# Patient Record
Sex: Female | Born: 1945
Health system: Southern US, Community
[De-identification: ages and names within clinical notes are randomized; demographics above are authoritative.]

## PROBLEM LIST (undated history)

## (undated) DIAGNOSIS — E079 Disorder of thyroid, unspecified: Secondary | ICD-10-CM

## (undated) DIAGNOSIS — C801 Malignant (primary) neoplasm, unspecified: Secondary | ICD-10-CM

---
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
## 1973-03-05 HISTORY — PX: THYROIDECTOMY: SHX17

## 2001-03-05 HISTORY — PX: ABDOMINAL HYSTERECTOMY: SHX81

## 2002-02-11 ENCOUNTER — Observation Stay (HOSPITAL_COMMUNITY): Admission: RE | Admit: 2002-02-11 | Discharge: 2002-02-12 | Payer: Self-pay | Admitting: Obstetrics and Gynecology

## 2004-01-19 ENCOUNTER — Ambulatory Visit: Payer: Self-pay

## 2006-02-14 ENCOUNTER — Ambulatory Visit: Payer: Self-pay

## 2007-04-09 ENCOUNTER — Ambulatory Visit: Payer: Self-pay

## 2007-10-10 LAB — HM COLONOSCOPY

## 2008-04-28 ENCOUNTER — Ambulatory Visit: Payer: Self-pay

## 2009-04-12 ENCOUNTER — Ambulatory Visit: Payer: Self-pay | Admitting: Sports Medicine

## 2009-05-05 ENCOUNTER — Ambulatory Visit: Payer: Self-pay

## 2011-05-15 ENCOUNTER — Ambulatory Visit: Payer: Self-pay

## 2013-02-17 ENCOUNTER — Encounter: Payer: Self-pay | Admitting: Sports Medicine

## 2013-02-17 ENCOUNTER — Encounter (INDEPENDENT_AMBULATORY_CARE_PROVIDER_SITE_OTHER): Payer: Self-pay

## 2013-02-17 ENCOUNTER — Ambulatory Visit (INDEPENDENT_AMBULATORY_CARE_PROVIDER_SITE_OTHER): Payer: BC Managed Care – PPO | Admitting: Sports Medicine

## 2013-02-17 VITALS — Ht 66.0 in | Wt 150.0 lb

## 2013-02-17 DIAGNOSIS — M25561 Pain in right knee: Secondary | ICD-10-CM

## 2013-02-17 DIAGNOSIS — M25569 Pain in unspecified knee: Secondary | ICD-10-CM

## 2013-02-17 MED ORDER — NAPROXEN 500 MG PO TABS: ORAL_TABLET | ORAL | Status: DC

## 2013-02-17 NOTE — Progress Notes (Signed)
   Subjective:    Patient ID: Kellie Roberson, female    DOB: 1946/02/09, 67 y.o.   MRN: [id number]  HPI chief complaint: Right knee pain  Very pleasant 67 year old female comes in today complaining of one week of right knee pain. No specific injury that she can recall but rather sudden onset of medial sided knee pain that began upon awakening one morning. She describes an aching discomfort along the medial knee which is worse with weightbearing and with twisting. She has noticed some mild swelling as well. No instability. No locking of the knee. No radiating pain. Symptoms improve at rest. She has been taking intermittent doses of over-the-counter ibuprofen which has seemed to help. She has also tried a off the shelf knee sleeve which has not been of any benefit. She denies any problems with his knee in the past. No prior knee surgeries. No associated numbness or tingling.  Past medical history and current medications are reviewed She has a history of hypothyroidism after a thyroidectomy No known drug allergies She does not smoke, drinks a glass of wine each evening, works as a Comptroller at [employer]    Review of Systems     Objective:   Physical Exam Well-developed, fit-appearing. No acute distress. Awake alert and oriented x3. Vital signs are reviewed  Right knee: Range of motion 0-130. No effusion. There is tenderness to palpation along the medial joint line with pain but no popping with McMurray's maneuver. Positive Thessaly's medially. No tenderness to palpation along the lateral joint line. No significant patellofemoral crepitus. Good overall alignment. Knee is stable to valgus and varus stressing. Negative anterior drawer, negative posterior drawer. Neurovascularly intact distally. Walking with a slight limp.  MSK ultrasound of the right knee: No obvious joint effusion. Visualized portion of the medial meniscus appears to be intact but there is some bulging of the medial meniscus  from the joint space.       Assessment & Plan:  Right knee pain likely secondary to degenerative medial meniscal tear versus DJD  I discussed the patient's options with her including oral anti-inflammatories versus a cortisone injection. We're going to try Naprosyn 500 mg twice a day with food for 7 days. She is cautioned about GI upset. We will couple this with a body helix compression sleeve and with a home exercise program. Patient will followup with me in approximately 10 days. She has a trip [place] for early in January and if her symptoms persist I would reconsider a cortisone injection. She will call with questions or concerns prior to her followup visit.

## 2013-03-02 ENCOUNTER — Ambulatory Visit: Payer: BC Managed Care – PPO | Admitting: Sports Medicine

## 2013-06-10 ENCOUNTER — Ambulatory Visit: Payer: Self-pay | Admitting: Nurse Practitioner

## 2013-12-04 LAB — HM PAP SMEAR: HM PAP: NEGATIVE

## 2015-06-07 ENCOUNTER — Other Ambulatory Visit: Payer: Self-pay | Admitting: Medical

## 2015-06-07 DIAGNOSIS — Z1231 Encounter for screening mammogram for malignant neoplasm of breast: Secondary | ICD-10-CM

## 2015-06-09 ENCOUNTER — Ambulatory Visit
Admission: RE | Admit: 2015-06-09 | Discharge: 2015-06-09 | Disposition: A | Discharge status: Home / Self Care | Payer: BLUE CROSS/BLUE SHIELD | Source: Ambulatory Visit | Attending: Medical | Admitting: Medical

## 2015-06-09 DIAGNOSIS — Z1231 Encounter for screening mammogram for malignant neoplasm of breast: Secondary | ICD-10-CM | POA: Diagnosis present

## 2015-06-10 LAB — LIPID PANEL
Cholesterol: 204 mg/dL — AB (ref 0–200)
HDL: 95 mg/dL — AB (ref 35–70)
LDL CALC: 99 mg/dL
TRIGLYCERIDES: 52 mg/dL (ref 40–160)

## 2015-06-10 LAB — HEPATIC FUNCTION PANEL
ALK PHOS: 72 U/L (ref 25–125)
ALT: 24 U/L (ref 7–35)
AST: 19 U/L (ref 13–35)
BILIRUBIN, TOTAL: 1 mg/dL

## 2015-06-10 LAB — CBC AND DIFFERENTIAL
HCT: 41 % (ref 36–46)
HEMOGLOBIN: 13.9 g/dL (ref 12.0–16.0)
Neutrophils Absolute: 2 /uL
PLATELETS: 204 10*3/uL (ref 150–399)
WBC: 4 10*3/mL

## 2015-06-10 LAB — BASIC METABOLIC PANEL
BUN: 18 mg/dL (ref 4–21)
CREATININE: 0.7 mg/dL (ref 0.5–1.1)
Glucose: 89 mg/dL
Potassium: 4.5 mmol/L (ref 3.4–5.3)
Sodium: 139 mmol/L (ref 137–147)

## 2015-06-10 LAB — TSH: TSH: 0.04 u[IU]/mL — AB (ref 0.41–5.90)

## 2015-10-18 ENCOUNTER — Telehealth: Payer: Self-pay | Admitting: Internal Medicine

## 2015-10-18 NOTE — Telephone Encounter (Signed)
Patient is new to practice, first  appt is august 18th. I Received thyroid tests from [facility] and they are normal.  Does she need a refill before the 18th?

## 2015-10-19 NOTE — Telephone Encounter (Signed)
Left message for patient to return call to office. 

## 2015-10-21 ENCOUNTER — Encounter: Payer: Self-pay | Admitting: Internal Medicine

## 2015-10-21 ENCOUNTER — Ambulatory Visit (INDEPENDENT_AMBULATORY_CARE_PROVIDER_SITE_OTHER): Payer: BLUE CROSS/BLUE SHIELD | Admitting: Internal Medicine

## 2015-10-21 VITALS — BP 122/84 | HR 66 | Temp 98.3°F | Resp 12 | Ht 65.75 in | Wt 142.0 lb

## 2015-10-21 DIAGNOSIS — Z79890 Hormone replacement therapy (postmenopausal): Secondary | ICD-10-CM

## 2015-10-21 DIAGNOSIS — Z90710 Acquired absence of both cervix and uterus: Secondary | ICD-10-CM | POA: Diagnosis not present

## 2015-10-21 DIAGNOSIS — Z90722 Acquired absence of ovaries, bilateral: Secondary | ICD-10-CM

## 2015-10-21 DIAGNOSIS — S93529D Sprain of metatarsophalangeal joint of unspecified toe(s), subsequent encounter: Secondary | ICD-10-CM

## 2015-10-21 DIAGNOSIS — E890 Postprocedural hypothyroidism: Secondary | ICD-10-CM

## 2015-10-21 DIAGNOSIS — Z90711 Acquired absence of uterus with remaining cervical stump: Secondary | ICD-10-CM

## 2015-10-21 LAB — TSH: TSH: 3.5 u[IU]/mL (ref <=5.90)

## 2015-10-21 MED ORDER — SYNTHROID 88 MCG PO TABS: 88.0000 ug | ORAL_TABLET | Freq: Every day | ORAL | 0 refills | Status: DC

## 2015-10-21 MED ORDER — ESTRADIOL 0.025 MG/24HR TD PTWK: 0.0006 mg | MEDICATED_PATCH | TRANSDERMAL | 3 refills | Status: DC

## 2015-10-21 NOTE — Progress Notes (Signed)
Pre visit review using our clinic review tool, if applicable. No additional management support is needed unless otherwise documented below in the visit note. 

## 2015-10-21 NOTE — Patient Instructions (Signed)
Welcome !!   I enjoyed meeting you today!  We should repeat your thyroid levels in 4 weeks to be sure we have a steady state .  This can be done here or at [place] if preferred

## 2015-10-21 NOTE — Progress Notes (Signed)
Subjective:  Patient ID: Kellie Roberson, female    DOB: 03-24-1945  Age: 70 y.o. MRN: [id number]  CC: The primary encounter diagnosis was Postoperative hypothyroidism. Diagnoses of Turf toe, subsequent encounter, S/P laparoscopic supracervical hysterectomy, S/P BSO (bilateral salpingo-oophorectomy), and Post-menopause on HRT (hormone replacement therapy) were also pertinent to this visit.  HPI Kellie Roberson presents for establishing care.. Transferring care from GYN.  Works at [employer],  [place] been getting care at [employer] .  Cc left great toe pain and gradual  enlargement of MT due to remote injury from jamming the big toe; the injury was diagnosed as   "turf toe" during a recent orthopedic evaluation at  [facility]  recommended surgery involved fusing the great toe .  Discussed referral to [facility]  Starting to have pain walking on hardwood floors.   total thyroidectomy  in mid 20's for hyperthyroid.  Has been  On a stable dose of Synthroid (100 mcg)for years. Until recently when her TSH was suppressed and a PA at the [facility] mistakenly increased her dose, followed by suspension of all thyroid supplementation for  2 weeks, which she did not tolerate at all due to excessive fatigue.   Has been taking 88 mcg daily for the past 2 weeks   History of supracervical hysterectomy/ BSO for management of  severe prolapse. Started Climara post op   Has been using the lowest dose  patch and leaving on for 2 weeks before changing patch .Marland Kitchen   TAKES A PROBIOTIC DAILY.  No gi issues.   Dermatology follow up  Hinda Kehr in [place] regularly on [place] one hour 3-4 days per week. 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  Volunteers for [employer] projects.  DEXA scan was done  Mammogram done May 2017  Has an identical twin sister has MVP .  Was advised to have screening so had an ECHO.  No MVP seen    History  Kellie Roberson has no past medical history on file.   She has a past surgical history that includes Abdominal hysterectomy (2003) and Thyroidectomy (1975).   Her family history includes Alzheimer's disease in her mother; Glaucoma in her father; Hypertension in her father; Mitral valve prolapse in her sister; Thyroid disease in her sister.She reports that she has never smoked. She has never used smokeless tobacco. She reports that she drinks alcohol. She reports that she does not use drugs.  Outpatient Medications Prior to Visit  Medication Sig Dispense Refill  . CLIMARA 0.025 MG/24HR patch Place 0.025 patches onto the skin. Every other week    . naproxen (NAPROSYN) 500 MG tablet Take one tab twice a day with food for 7 days then prn 40 tablet 0  . SYNTHROID 125 MCG tablet Take 125 mcg by mouth daily.     No facility-administered medications prior to visit.     Review of Systems:  Patient denies headache, fevers, malaise, unintentional weight loss, skin rash, eye pain, sinus congestion and sinus pain, sore throat, dysphagia,  hemoptysis , cough, dyspnea, wheezing, chest pain, palpitations, orthopnea, edema, abdominal pain, nausea, melena, diarrhea, constipation, flank pain, dysuria, hematuria, urinary  Frequency, nocturia, numbness, tingling, seizures,  Focal weakness, Loss of consciousness,  Tremor, insomnia, depression, anxiety, and suicidal ideation.     Objective:  BP 122/84   Pulse 66   Temp 98.3 F (36.8 C)   Resp 12   Ht  5' 5.75" (1.67 m)   Wt 142 lb (64.4 kg)   BMI 23.09 kg/m   Physical Exam:  General appearance: alert, cooperative and appears stated age Ears: normal TM's and external ear canals both ears Throat: lips, mucosa, and tongue normal; teeth and gums normal Neck: no adenopathy, no carotid bruit, supple, symmetrical, trachea midline and thyroid not enlarged, symmetric, no tenderness/mass/nodules Back: symmetric, no curvature. ROM normal. No CVA  tenderness. Lungs: clear to auscultation bilaterally Heart: regular rate and rhythm, S1, S2 normal, no murmur, click, rub or gallop Abdomen: soft, non-tender; bowel sounds normal; no masses,  no organomegaly Pulses: 2+ and symmetric Skin: Skin color, texture, turgor normal. No rashes or lesions Lymph nodes: Cervical, supraclavicular, and axillary nodes normal.   Assessment & Plan:   Problem List Items Addressed This Visit    Turf toe    Her deformity  has decreased in size over the past 2 years.  Referral to [facility] to discuss surgical options      S/P laparoscopic supracervical hysterectomy   S/P BSO (bilateral salpingo-oophorectomy)   Post-menopause on HRT (hormone replacement therapy)    Continue low dose Climara for mitigation of severe menopausal symptoms including hot flashes and mood swings.sh eiw aware of the midly increased risk of CAD and VTE      Postoperative hypothyroidism - Primary    currenlty taking 88 mcg daiy repeat tsh in 4 weeks       Relevant Medications   SYNTHROID 88 MCG tablet    Other Visit Diagnoses   None.     I have discontinued Ms. Rebello's SYNTHROID and naproxen. I have changed her CLIMARA to estradiol. I have also changed her SYNTHROID. Additionally, I am having her maintain her vitamin C and Probiotic Product (PROBIOTIC-10 PO).  Meds ordered this encounter  Medications  . DISCONTD: SYNTHROID 88 MCG tablet    Sig: Take 88 mcg by mouth daily before breakfast.     Refill:  0  . Ascorbic Acid (VITAMIN C) 100 MG tablet    Sig: Take 100 mg by mouth daily as needed.  . Probiotic Product (PROBIOTIC-10 PO)    Sig: Take by mouth daily.  Marland Kitchen SYNTHROID 88 MCG tablet    Sig: Take 1 tablet (88 mcg total) by mouth daily before breakfast. NAME BRAND ONLY    Dispense:  30 tablet    Refill:  0  . estradiol (CLIMARA) 0.025 mg/24hr patch    Sig: Place 1 patch (0.025 mg total) onto the skin once a week.    Dispense:  12 patch    Refill:  3    PRIME  MAIL KEEP ON FILE FOR FUTURE REFILLS     A total of 45 minutes was spent with patient more than half of which was spent in counseling patient on the above mentioned issues , reviewing and explaining recent labs and imaging studies done, and coordination of care.  Medications Discontinued During This Encounter  Medication Reason  . naproxen (NAPROSYN) 500 MG tablet Completed Course  . SYNTHROID 125 MCG tablet Change in therapy  . SYNTHROID 88 MCG tablet Reorder  . CLIMARA 0.025 MG/24HR patch Reorder    Follow-up: Return in about 6 months (around 04/22/2016).   [name], MD

## 2015-10-23 DIAGNOSIS — Z90722 Acquired absence of ovaries, bilateral: Secondary | ICD-10-CM | POA: Insufficient documentation

## 2015-10-23 DIAGNOSIS — E890 Postprocedural hypothyroidism: Secondary | ICD-10-CM | POA: Insufficient documentation

## 2015-10-23 DIAGNOSIS — Z90711 Acquired absence of uterus with remaining cervical stump: Secondary | ICD-10-CM | POA: Insufficient documentation

## 2015-10-23 DIAGNOSIS — Z79890 Hormone replacement therapy (postmenopausal): Secondary | ICD-10-CM | POA: Insufficient documentation

## 2015-10-23 DIAGNOSIS — S93529A Sprain of metatarsophalangeal joint of unspecified toe(s), initial encounter: Secondary | ICD-10-CM | POA: Insufficient documentation

## 2015-10-23 NOTE — Assessment & Plan Note (Signed)
Continue low dose Climara for mitigation of severe menopausal symptoms including hot flashes and mood swings.sh eiw aware of the midly increased risk of CAD and VTE

## 2015-10-23 NOTE — Assessment & Plan Note (Signed)
currenlty taking 88 mcg daiy repeat tsh in 4 weeks

## 2015-10-23 NOTE — Assessment & Plan Note (Signed)
Her deformity  has decreased in size over the past 2 years.  Referral to [facility] to discuss surgical options

## 2015-10-28 ENCOUNTER — Encounter: Payer: Self-pay | Admitting: Internal Medicine

## 2015-10-28 ENCOUNTER — Other Ambulatory Visit: Payer: Self-pay | Admitting: Internal Medicine

## 2015-10-28 DIAGNOSIS — S93529S Sprain of metatarsophalangeal joint of unspecified toe(s), sequela: Secondary | ICD-10-CM

## 2015-10-28 NOTE — Progress Notes (Signed)
amb ref to podiatry  

## 2015-10-28 NOTE — Telephone Encounter (Signed)
Requesting referral to [facility].

## 2015-10-30 LAB — HM DEXA SCAN: HM Dexa Scan: NORMAL

## 2015-10-31 ENCOUNTER — Encounter: Payer: Self-pay | Admitting: Internal Medicine

## 2015-11-01 ENCOUNTER — Other Ambulatory Visit: Payer: Self-pay | Admitting: Internal Medicine

## 2015-11-01 MED ORDER — LEVOTHYROXINE SODIUM 100 MCG PO TABS: 100.0000 ug | ORAL_TABLET | Freq: Every day | ORAL | 3 refills | Status: DC

## 2015-11-09 ENCOUNTER — Encounter: Payer: Self-pay | Admitting: Internal Medicine

## 2015-12-28 ENCOUNTER — Telehealth: Payer: Self-pay | Admitting: Internal Medicine

## 2015-12-28 DIAGNOSIS — E890 Postprocedural hypothyroidism: Secondary | ICD-10-CM

## 2015-12-28 NOTE — Telephone Encounter (Signed)
Thyroid panel ordered and order printed .  In blue folder

## 2015-12-28 NOTE — Telephone Encounter (Signed)
Pt called and was asking if she could have orders faxed over to to [facility] so that she can have them done while she is at work. She is looking for a thyroid panel to be done. Fax # [phone].  Call pt @ [phone].

## 2015-12-28 NOTE — Telephone Encounter (Signed)
Last thyroid panel was in July OK to order now?

## 2016-01-03 LAB — TSH: TSH: 2.16 u[IU]/mL (ref 0.41–5.90)

## 2016-01-09 ENCOUNTER — Encounter: Payer: Self-pay | Admitting: Internal Medicine

## 2016-01-11 NOTE — Telephone Encounter (Signed)
Results attached.

## 2016-01-15 ENCOUNTER — Telehealth: Payer: Self-pay | Admitting: Internal Medicine

## 2016-01-15 MED ORDER — LEVOTHYROXINE SODIUM 100 MCG PO TABS: 100.0000 ug | ORAL_TABLET | Freq: Every day | ORAL | 3 refills | Status: DC

## 2016-01-15 NOTE — Telephone Encounter (Signed)
Your Thyroid function is WNL on current dose.  No current changes needed. Levothyroxine refilled.

## 2016-01-16 ENCOUNTER — Encounter: Payer: Self-pay | Admitting: Internal Medicine

## 2016-01-16 NOTE — Telephone Encounter (Signed)
Letter mailed

## 2016-01-31 ENCOUNTER — Encounter: Payer: Self-pay | Admitting: *Deleted

## 2016-04-23 ENCOUNTER — Ambulatory Visit (INDEPENDENT_AMBULATORY_CARE_PROVIDER_SITE_OTHER): Payer: BLUE CROSS/BLUE SHIELD | Admitting: Internal Medicine

## 2016-04-23 ENCOUNTER — Encounter: Payer: Self-pay | Admitting: Internal Medicine

## 2016-04-23 DIAGNOSIS — Z79890 Hormone replacement therapy (postmenopausal): Secondary | ICD-10-CM

## 2016-04-23 DIAGNOSIS — R030 Elevated blood-pressure reading, without diagnosis of hypertension: Secondary | ICD-10-CM | POA: Diagnosis not present

## 2016-04-23 DIAGNOSIS — E890 Postprocedural hypothyroidism: Secondary | ICD-10-CM

## 2016-04-23 NOTE — Assessment & Plan Note (Addendum)
Lab Results  Component Value Date   TSH 2.16 01/03/2016   Energy level is good.  Will recheck in April

## 2016-04-23 NOTE — Assessment & Plan Note (Signed)
She has no history of hypertension but has had several elevated readings.  She has been asked to check her pressures at home and submit readings for evaluation. Renal function will be checked today

## 2016-04-23 NOTE — Assessment & Plan Note (Addendum)
using one patch every 2 weeks for sleep issues and atrophic vaginitis She is aware of the risks

## 2016-04-23 NOTE — Progress Notes (Signed)
Subjective:  Patient ID: Kellie Roberson, female    DOB: 10-28-1945  Age: 71 y.o. MRN: [id number]  CC: Diagnoses of Post-menopause on HRT (hormone replacement therapy), Elevated blood pressure reading in office without diagnosis of hypertension, and Postoperative hypothyroidism were pertinent to this visit.  HPI Kellie Roberson presents for follow up  On low t4, etc   .feels generally well,  Just returned from a brief hiking vacation.  Energy level is good,  Sleeping well .  Home bps have been checked and have been < 130/80  Has had 3 prior  Colonoscopies (one diagnostic for colitis,  Negative ) and has had a prior DEXA   Seeing dermatology benitas graham for skin lesion on her back . Received a steroid  Injection  which transiently resolved but returned with infection. Underwent I & d, followed by use of  Topical and Oral septra DS x  10 days 1.5 weeks ago .  Took a probiotic  Estradiol patch since early 50's wear one patch for 2 weeks    Lab Results  Component Value Date   TSH 2.16 01/03/2016     Outpatient Medications Prior to Visit  Medication Sig Dispense Refill  . Ascorbic Acid (VITAMIN C) 100 MG tablet Take 100 mg by mouth daily as needed.    Marland Kitchen estradiol (CLIMARA) 0.025 mg/24hr patch Place 1 patch (0.025 mg total) onto the skin once a week. 12 patch 3  . levothyroxine (SYNTHROID, LEVOTHROID) 100 MCG tablet Take 1 tablet (100 mcg total) by mouth daily. NAME BRAND ONLY 90 tablet 3  . Probiotic Product (PROBIOTIC-10 PO) Take by mouth daily.     No facility-administered medications prior to visit.     Review of Systems;  Patient denies headache, fevers, malaise, unintentional weight loss, skin rash, eye pain, sinus congestion and sinus pain, sore throat, dysphagia,  hemoptysis , cough, dyspnea, wheezing, chest pain, palpitations, orthopnea, edema, abdominal pain, nausea, melena, diarrhea, constipation, flank pain, dysuria, hematuria, urinary  Frequency, nocturia, numbness,  tingling, seizures,  Focal weakness, Loss of consciousness,  Tremor, insomnia, depression, anxiety, and suicidal ideation.      Objective:  BP 130/80   Pulse 65   Resp 16   Wt 152 lb (68.9 kg)   SpO2 96%   BMI 24.72 kg/m   BP Readings from Last 3 Encounters:  04/23/16 130/80  10/21/15 122/84    Wt Readings from Last 3 Encounters:  04/23/16 152 lb (68.9 kg)  10/21/15 142 lb (64.4 kg)  02/17/13 150 lb (68 kg)    General appearance: alert, cooperative and appears stated age Ears: normal TM's and external ear canals both ears Throat: lips, mucosa, and tongue normal; teeth and gums normal Neck: no adenopathy, no carotid bruit, supple, symmetrical, trachea midline and thyroid not enlarged, symmetric, no tenderness/mass/nodules Back: symmetric, no curvature. ROM normal. No CVA tenderness. Lungs: clear to auscultation bilaterally Heart: regular rate and rhythm, S1, S2 normal, no murmur, click, rub or gallop Abdomen: soft, non-tender; bowel sounds normal; no masses,  no organomegaly Pulses: 2+ and symmetric Skin: left shoulder blade with dime sized erythematous macule with draining pore. Lymph nodes: Cervical, supraclavicular, and axillary nodes normal.  No results found for: HGBA1C  Lab Results  Component Value Date   CREATININE 0.7 06/10/2015    Lab Results  Component Value Date   WBC 4.0 06/10/2015   HGB 13.9 06/10/2015   HCT 41 06/10/2015   PLT 204 06/10/2015   CHOL 204 (A) 06/10/2015  TRIG 52 06/10/2015   HDL 95 (A) 06/10/2015   LDLCALC 99 06/10/2015   ALT 24 06/10/2015   AST 19 06/10/2015   NA 139 06/10/2015   K 4.5 06/10/2015   CREATININE 0.7 06/10/2015   BUN 18 06/10/2015   TSH 2.16 01/03/2016    Mm Digital Screening Bilateral  Result Date: 06/09/2015 CLINICAL DATA:  Screening. EXAM: DIGITAL SCREENING BILATERAL MAMMOGRAM WITH CAD COMPARISON:  Previous exam(s). ACR Breast Density Category b: There are scattered areas of fibroglandular density. FINDINGS:  There are no findings suspicious for malignancy. Images were processed with CAD. IMPRESSION: No mammographic evidence of malignancy. A result letter of this screening mammogram will be mailed directly to the patient. RECOMMENDATION: Screening mammogram in one year. (Code:SM-B-01Y) BI-RADS CATEGORY  1: Negative. Electronically Signed   By: Dorise Bullion III M.D   On: 06/09/2015 12:15    Assessment & Plan:   Problem List Items Addressed This Visit    Elevated blood pressure reading in office without diagnosis of hypertension    She has no history of hypertension but has had several elevated readings.  She has been asked to check her pressures at home and submit readings for evaluation. Renal function will be checked today      Post-menopause on HRT (hormone replacement therapy)    using one patch every 2 weeks for sleep issues and atrophic vaginitis She is aware of the risks      Postoperative hypothyroidism    Lab Results  Component Value Date   TSH 2.16 01/03/2016   Energy level is good.  Will recheck in April           I am having Ms. Darnold maintain her vitamin C, Probiotic Product (PROBIOTIC-10 PO), estradiol, and levothyroxine.  No orders of the defined types were placed in this encounter.   There are no discontinued medications.  Follow-up: Return in about 6 months (around 10/21/2016) for CPe with pelvic .   [name], MD

## 2016-04-23 NOTE — Progress Notes (Signed)
Pre visit review using our clinic review tool, if applicable. No additional management support is needed unless otherwise documented below in the visit note. 

## 2016-04-23 NOTE — Patient Instructions (Signed)
You are due for repeat thyroid testing in April  We have not received your medical records yet   I need your last colonoscopy and DEXA report

## 2016-05-23 ENCOUNTER — Encounter: Payer: Self-pay | Admitting: Internal Medicine

## 2016-05-23 DIAGNOSIS — Z1239 Encounter for other screening for malignant neoplasm of breast: Secondary | ICD-10-CM

## 2016-05-23 NOTE — Telephone Encounter (Signed)
Thank you for your e mail.  Your mammogram order has been initiated.

## 2016-06-18 ENCOUNTER — Other Ambulatory Visit: Payer: Self-pay | Admitting: Internal Medicine

## 2016-06-18 DIAGNOSIS — Z1231 Encounter for screening mammogram for malignant neoplasm of breast: Secondary | ICD-10-CM

## 2016-06-20 ENCOUNTER — Ambulatory Visit
Admission: RE | Admit: 2016-06-20 | Discharge: 2016-06-20 | Disposition: A | Discharge status: Home / Self Care | Payer: BLUE CROSS/BLUE SHIELD | Source: Ambulatory Visit | Attending: Internal Medicine | Admitting: Internal Medicine

## 2016-06-20 DIAGNOSIS — Z1231 Encounter for screening mammogram for malignant neoplasm of breast: Secondary | ICD-10-CM | POA: Diagnosis present

## 2016-06-20 DIAGNOSIS — Z1239 Encounter for other screening for malignant neoplasm of breast: Secondary | ICD-10-CM

## 2016-07-11 ENCOUNTER — Other Ambulatory Visit: Payer: Self-pay

## 2016-07-11 DIAGNOSIS — E890 Postprocedural hypothyroidism: Secondary | ICD-10-CM

## 2016-07-12 LAB — TSH: TSH: 1.05 u[IU]/mL (ref 0.450–4.500)

## 2016-07-14 ENCOUNTER — Encounter: Payer: Self-pay | Admitting: Internal Medicine

## 2016-11-22 ENCOUNTER — Other Ambulatory Visit: Payer: Self-pay | Admitting: Internal Medicine

## 2017-01-02 ENCOUNTER — Ambulatory Visit: Payer: Self-pay

## 2017-01-07 ENCOUNTER — Encounter: Payer: Self-pay | Admitting: Internal Medicine

## 2017-01-08 MED ORDER — LEVOTHYROXINE SODIUM 100 MCG PO TABS: 100.0000 ug | ORAL_TABLET | Freq: Every day | ORAL | 1 refills | Status: DC

## 2017-01-22 ENCOUNTER — Telehealth: Payer: Self-pay

## 2017-01-22 NOTE — Telephone Encounter (Signed)
she does not need any more,  Had the Prevnar please change her HM so it stops saying she does

## 2017-01-22 NOTE — Telephone Encounter (Signed)
Copied from [place] [phone]. Topic: Appointment Scheduling - Scheduling Inquiry for Clinic >> Jan 22, 2017 11:28 AM Hewitt Shorts wrote: Reason for CRM: pt is needing to know if she needs to be scheduled with dr Derrel Nip for a pnemonia shot or not    Best number [phone]

## 2017-01-22 NOTE — Telephone Encounter (Signed)
Changed HM

## 2017-01-22 NOTE — Telephone Encounter (Signed)
Okay to schedule for pneumonia shot? It looks like she has the Prevnar in 11/2015.

## 2017-06-21 ENCOUNTER — Encounter: Payer: Self-pay | Admitting: Internal Medicine

## 2017-06-21 DIAGNOSIS — Z0000 Encounter for general adult medical examination without abnormal findings: Secondary | ICD-10-CM

## 2017-06-21 DIAGNOSIS — E890 Postprocedural hypothyroidism: Secondary | ICD-10-CM

## 2017-06-24 ENCOUNTER — Other Ambulatory Visit: Payer: Self-pay | Admitting: Internal Medicine

## 2017-06-24 DIAGNOSIS — Z1231 Encounter for screening mammogram for malignant neoplasm of breast: Secondary | ICD-10-CM

## 2017-06-24 NOTE — Telephone Encounter (Signed)
Please disregard previous message  Last office visit 04/23/16, last TSH 07/11/16 No office visit scheduled

## 2017-06-24 NOTE — Telephone Encounter (Deleted)
Left message for patient to call, to follow up with patient

## 2017-06-25 MED ORDER — PNEUMOCOCCAL VAC POLYVALENT 25 MCG/0.5ML IJ INJ: 0.5000 mL | INJECTION | INTRAMUSCULAR | 0 refills | Status: AC

## 2017-06-25 NOTE — Telephone Encounter (Signed)
pnumovax and outside labs ordered,  please print and distribute per [place] request

## 2017-06-26 NOTE — Telephone Encounter (Signed)
All orders have been faxed to fax number provided by pt in mycahrt message.

## 2017-07-02 ENCOUNTER — Ambulatory Visit
Admission: RE | Admit: 2017-07-02 | Discharge: 2017-07-02 | Disposition: A | Discharge status: Home / Self Care | Payer: BLUE CROSS/BLUE SHIELD | Source: Ambulatory Visit | Attending: Internal Medicine | Admitting: Internal Medicine

## 2017-07-02 DIAGNOSIS — Z1231 Encounter for screening mammogram for malignant neoplasm of breast: Secondary | ICD-10-CM

## 2017-07-09 ENCOUNTER — Other Ambulatory Visit: Payer: Self-pay

## 2017-07-09 DIAGNOSIS — Z0000 Encounter for general adult medical examination without abnormal findings: Secondary | ICD-10-CM

## 2017-07-10 LAB — LIPID PANEL
CHOL/HDL RATIO: 2.7 ratio (ref 0.0–4.4)
Cholesterol, Total: 229 mg/dL — ABNORMAL HIGH (ref 100–199)
HDL: 86 mg/dL (ref >39)
LDL Calculated: 128 mg/dL — ABNORMAL HIGH (ref 0–99)
Triglycerides: 73 mg/dL (ref 0–149)
VLDL Cholesterol Cal: 15 mg/dL (ref 5–40)

## 2017-07-10 LAB — COMPREHENSIVE METABOLIC PANEL
A/G RATIO: 1.8 (ref 1.2–2.2)
ALK PHOS: 73 IU/L (ref 39–117)
ALT: 23 IU/L (ref 0–32)
AST: 24 IU/L (ref 0–40)
Albumin: 4.2 g/dL (ref 3.5–4.8)
BUN / CREAT RATIO: 20 (ref 12–28)
BUN: 13 mg/dL (ref 8–27)
Bilirubin Total: 1.1 mg/dL (ref 0.0–1.2)
CO2: 22 mmol/L (ref 20–29)
CREATININE: 0.65 mg/dL (ref 0.57–1.00)
Calcium: 9.3 mg/dL (ref 8.7–10.3)
Chloride: 103 mmol/L (ref 96–106)
GFR calc Af Amer: 104 mL/min/{1.73_m2} (ref >59)
GFR calc non Af Amer: 90 mL/min/{1.73_m2} (ref >59)
GLOBULIN, TOTAL: 2.4 g/dL (ref 1.5–4.5)
Glucose: 90 mg/dL (ref 65–99)
Potassium: 4.3 mmol/L (ref 3.5–5.2)
Sodium: 141 mmol/L (ref 134–144)
Total Protein: 6.6 g/dL (ref 6.0–8.5)

## 2017-07-10 LAB — T4, FREE: FREE T4: 1.54 ng/dL (ref 0.82–1.77)

## 2017-07-10 LAB — TSH: TSH: 1.39 u[IU]/mL (ref 0.450–4.500)

## 2017-07-11 ENCOUNTER — Ambulatory Visit: Payer: Self-pay

## 2017-07-11 DIAGNOSIS — Z0000 Encounter for general adult medical examination without abnormal findings: Secondary | ICD-10-CM

## 2017-07-24 ENCOUNTER — Encounter: Payer: Self-pay | Admitting: Internal Medicine

## 2017-07-26 NOTE — Telephone Encounter (Signed)
Climara     Refilled: 11/22/2016  Levothyroxine      Refilled: 01/08/2017  Last OV: 04/23/2016 Next OV: not scheduled

## 2017-07-28 ENCOUNTER — Other Ambulatory Visit: Payer: Self-pay | Admitting: Internal Medicine

## 2017-07-28 MED ORDER — CLIMARA 0.025 MG/24HR TD PTWK: MEDICATED_PATCH | TRANSDERMAL | 0 refills | Status: DC

## 2017-07-28 MED ORDER — LEVOTHYROXINE SODIUM 100 MCG PO TABS: 100.0000 ug | ORAL_TABLET | Freq: Every day | ORAL | 1 refills | Status: DC

## 2017-09-11 DIAGNOSIS — Z872 Personal history of diseases of the skin and subcutaneous tissue: Secondary | ICD-10-CM | POA: Diagnosis not present

## 2017-09-11 DIAGNOSIS — Z1283 Encounter for screening for malignant neoplasm of skin: Secondary | ICD-10-CM | POA: Diagnosis not present

## 2017-09-11 DIAGNOSIS — L821 Other seborrheic keratosis: Secondary | ICD-10-CM | POA: Diagnosis not present

## 2017-09-11 DIAGNOSIS — L578 Other skin changes due to chronic exposure to nonionizing radiation: Secondary | ICD-10-CM | POA: Diagnosis not present

## 2017-10-18 ENCOUNTER — Other Ambulatory Visit: Payer: Self-pay | Admitting: Internal Medicine

## 2017-10-22 NOTE — Telephone Encounter (Signed)
Last filled 07/28/17 Last mammogram 07/02/17 Last office visit 04/23/16

## 2017-12-13 ENCOUNTER — Other Ambulatory Visit: Payer: Self-pay | Admitting: Internal Medicine

## 2017-12-13 MED ORDER — MELOXICAM 15 MG PO TABS: 15.0000 mg | ORAL_TABLET | Freq: Every day | ORAL | 2 refills | Status: DC

## 2018-01-20 ENCOUNTER — Other Ambulatory Visit: Payer: Self-pay | Admitting: Internal Medicine

## 2018-02-03 DIAGNOSIS — R69 Illness, unspecified: Secondary | ICD-10-CM | POA: Diagnosis not present

## 2018-02-06 DIAGNOSIS — M1712 Unilateral primary osteoarthritis, left knee: Secondary | ICD-10-CM | POA: Diagnosis not present

## 2018-02-18 DIAGNOSIS — M1712 Unilateral primary osteoarthritis, left knee: Secondary | ICD-10-CM | POA: Diagnosis not present

## 2018-02-18 DIAGNOSIS — S86812A Strain of other muscle(s) and tendon(s) at lower leg level, left leg, initial encounter: Secondary | ICD-10-CM | POA: Diagnosis not present

## 2018-02-20 DIAGNOSIS — M2022 Hallux rigidus, left foot: Secondary | ICD-10-CM | POA: Diagnosis not present

## 2018-02-20 DIAGNOSIS — M79672 Pain in left foot: Secondary | ICD-10-CM | POA: Diagnosis not present

## 2018-03-07 ENCOUNTER — Other Ambulatory Visit: Payer: Self-pay | Admitting: Internal Medicine

## 2018-03-07 NOTE — Telephone Encounter (Signed)
Ok to change to preferred medication estradiol?

## 2018-03-18 DIAGNOSIS — M179 Osteoarthritis of knee, unspecified: Secondary | ICD-10-CM | POA: Diagnosis not present

## 2018-03-18 DIAGNOSIS — M1712 Unilateral primary osteoarthritis, left knee: Secondary | ICD-10-CM | POA: Diagnosis not present

## 2018-04-24 ENCOUNTER — Other Ambulatory Visit: Payer: Self-pay | Admitting: Internal Medicine

## 2018-07-20 ENCOUNTER — Other Ambulatory Visit: Payer: Self-pay | Admitting: Internal Medicine

## 2018-07-20 DIAGNOSIS — R030 Elevated blood-pressure reading, without diagnosis of hypertension: Secondary | ICD-10-CM

## 2018-07-20 DIAGNOSIS — E890 Postprocedural hypothyroidism: Secondary | ICD-10-CM

## 2018-07-20 MED ORDER — SYNTHROID 100 MCG PO TABS: ORAL_TABLET | ORAL | 1 refills | Status: DC

## 2018-07-20 MED ORDER — ESTRADIOL 0.025 MG/24HR TD PTTW: MEDICATED_PATCH | TRANSDERMAL | 3 refills | Status: DC

## 2018-07-20 MED ORDER — MELOXICAM 15 MG PO TABS: 15.0000 mg | ORAL_TABLET | Freq: Every day | ORAL | 0 refills | Status: DC

## 2018-07-20 NOTE — Progress Notes (Signed)
meds refilled and sent to mail order.  Fasting labs ordered and patient advised to call office for lab appt.

## 2018-07-22 ENCOUNTER — Other Ambulatory Visit (INDEPENDENT_AMBULATORY_CARE_PROVIDER_SITE_OTHER): Payer: Medicare HMO

## 2018-07-22 ENCOUNTER — Other Ambulatory Visit: Payer: Self-pay

## 2018-07-22 DIAGNOSIS — R030 Elevated blood-pressure reading, without diagnosis of hypertension: Secondary | ICD-10-CM

## 2018-07-22 DIAGNOSIS — E890 Postprocedural hypothyroidism: Secondary | ICD-10-CM | POA: Diagnosis not present

## 2018-07-22 LAB — COMPREHENSIVE METABOLIC PANEL
ALT: 27 U/L (ref 0–35)
AST: 24 U/L (ref 0–37)
Albumin: 4.1 g/dL (ref 3.5–5.2)
Alkaline Phosphatase: 71 U/L (ref 39–117)
BUN: 17 mg/dL (ref 6–23)
CO2: 29 mEq/L (ref 19–32)
Calcium: 8.8 mg/dL (ref 8.4–10.5)
Chloride: 104 mEq/L (ref 96–112)
Creatinine, Ser: 0.58 mg/dL (ref 0.40–1.20)
GFR: 102.47 mL/min (ref >60.00)
Glucose, Bld: 87 mg/dL (ref 70–99)
Potassium: 3.9 mEq/L (ref 3.5–5.1)
Sodium: 139 mEq/L (ref 135–145)
Total Bilirubin: 1.4 mg/dL — ABNORMAL HIGH (ref 0.2–1.2)
Total Protein: 6.8 g/dL (ref 6.0–8.3)

## 2018-07-22 LAB — LIPID PANEL
Cholesterol: 224 mg/dL — ABNORMAL HIGH (ref 0–200)
HDL: 81.9 mg/dL (ref >39.00)
LDL Cholesterol: 127 mg/dL — ABNORMAL HIGH (ref 0–99)
NonHDL: 141.87
Total CHOL/HDL Ratio: 3
Triglycerides: 73 mg/dL (ref 0.0–149.0)
VLDL: 14.6 mg/dL (ref 0.0–40.0)

## 2018-07-22 LAB — TSH: TSH: 1.1 u[IU]/mL (ref 0.35–4.50)

## 2018-07-24 ENCOUNTER — Other Ambulatory Visit: Payer: Self-pay | Admitting: Internal Medicine

## 2018-07-25 ENCOUNTER — Ambulatory Visit (INDEPENDENT_AMBULATORY_CARE_PROVIDER_SITE_OTHER): Payer: Medicare HMO | Admitting: Internal Medicine

## 2018-07-25 ENCOUNTER — Encounter: Payer: Self-pay | Admitting: Internal Medicine

## 2018-07-25 ENCOUNTER — Other Ambulatory Visit: Payer: Self-pay

## 2018-07-25 DIAGNOSIS — E890 Postprocedural hypothyroidism: Secondary | ICD-10-CM

## 2018-07-25 DIAGNOSIS — Z79890 Hormone replacement therapy (postmenopausal): Secondary | ICD-10-CM

## 2018-07-25 DIAGNOSIS — Z1211 Encounter for screening for malignant neoplasm of colon: Secondary | ICD-10-CM

## 2018-07-25 DIAGNOSIS — R030 Elevated blood-pressure reading, without diagnosis of hypertension: Secondary | ICD-10-CM

## 2018-07-25 DIAGNOSIS — Z1239 Encounter for other screening for malignant neoplasm of breast: Secondary | ICD-10-CM

## 2018-07-25 DIAGNOSIS — M810 Age-related osteoporosis without current pathological fracture: Secondary | ICD-10-CM | POA: Diagnosis not present

## 2018-07-25 DIAGNOSIS — W57XXXS Bitten or stung by nonvenomous insect and other nonvenomous arthropods, sequela: Secondary | ICD-10-CM

## 2018-07-25 MED ORDER — DOXYCYCLINE HYCLATE 100 MG PO TABS: 100.0000 mg | ORAL_TABLET | Freq: Two times a day (BID) | ORAL | 0 refills | Status: DC

## 2018-07-25 NOTE — Progress Notes (Signed)
Virtual Visit via [email or web address]  This visit type was conducted due to national recommendations for restrictions regarding the COVID-19 pandemic (e.g. social distancing).  This format is felt to be most appropriate for this patient at this time.  All issues noted in this document were discussed and addressed.  No physical exam was performed (except for noted visual exam findings with Video Visits).   I connected with@ on 07/25/18 at  8:00 AM EDT by a video enabled telemedicine application and verified that I am speaking with the correct person using two identifiers. Location patient: home Location provider: home office Persons participating in the virtual visit: patient, provider  I discussed the limitations, risks, security and privacy concerns of performing an evaluation and management service by telephone and the availability of in person appointments. I also discussed with the patient that there may be a patient responsible charge related to this service. The patient expressed understanding and agreed to proceed.  Reason for visit: annual follow up   HPI:  Patient was last seen February 2018.    The patient has no signs or symptoms of COVID 19 infection (fever, cough, sore throat  or shortness of breath beyond what is typical for patient).  Patient denies contact with other persons with the above mentioned symptoms or with anyone confirmed to have COVID 19 .  She has been minimizing her contact with the public and using a mask and hand sanitizer when she comes into any contact with the public.   She feels generally well,  Is maintaining a healthy weight and exercising regularly.   She has been screened for depression and falls.    ROS: See pertinent positives and negatives per HPI.  No past medical history on file.  Past Surgical History:  Procedure Laterality Date  . ABDOMINAL HYSTERECTOMY  2003   total-had some structural damage after 2nd pregnancy  . THYROIDECTOMY  1975   was  hyperthyroid    Family History  Problem Relation Age of Onset  . Alzheimer's disease Mother   . Hypertension Father   . Glaucoma Father   . Thyroid disease Sister   . Mitral valve prolapse Sister   . Breast cancer Neg Hx     SOCIAL HX:  reports that she has never smoked. She has never used smokeless tobacco. She reports current alcohol use. She reports that she does not use drugs.  Current Outpatient Medications:  .  Ascorbic Acid (VITAMIN C) 100 MG tablet, Take 100 mg by mouth daily as needed., Disp: , Rfl:  .  estradiol (VIVELLE-DOT) 0.025 MG/24HR, Please specify directions, refills and quantity, Disp: 8 patch, Rfl: 3 .  meloxicam (MOBIC) 15 MG tablet, Take 1 tablet (15 mg total) by mouth daily., Disp: 90 tablet, Rfl: 0 .  Probiotic Product (PROBIOTIC-10 PO), Take by mouth daily., Disp: , Rfl:  .  doxycycline (VIBRA-TABS) 100 MG tablet, Take 1 tablet (100 mg total) by mouth 2 (two) times daily., Disp: 20 tablet, Rfl: 0 .  SYNTHROID 100 MCG tablet, TAKE 1 TABLET (100 MCG TOTAL) BY MOUTH DAILY. NAME BRAND ONLY, Disp: 90 tablet, Rfl: 1  EXAM:  VITALS per patient if applicable:  GENERAL: alert, oriented, appears well and in no acute distress  HEENT: atraumatic, conjunttiva clear, no obvious abnormalities on inspection of external nose and ears  NECK: normal movements of the head and neck  LUNGS: on inspection no signs of respiratory distress, breathing rate appears normal, no obvious gross SOB, gasping or wheezing  CV: no  obvious cyanosis  MS: moves all visible extremities without noticeable abnormality  PSYCH/NEURO: pleasant and cooperative, no obvious depression or anxiety, speech and thought processing grossly intact  ASSESSMENT AND PLAN:  Discussed the following assessment and plan:  Breast cancer screening - Plan: MM 3D SCREEN BREAST BILATERAL  Tick bite, sequela - Plan: doxycycline (VIBRA-TABS) 100 MG tablet  Post-menopause on HRT (hormone replacement  therapy)  Postoperative hypothyroidism  Elevated blood pressure reading in office without diagnosis of hypertension  Age-related osteoporosis without current pathological fracture  Colon cancer screening  Post-menopause on HRT (hormone replacement therapy) She continues to benefit from HRT and is using one patch every 2 weeks for sleep issues and atrophic vaginitis. She is aware of the potential long term  risks  Postoperative hypothyroidism TSH is therapeutic,  Continue current levothyoxine dose,  Brand name only,  Refilled. Recheck annually per patient preference .  Lab Results  Component Value Date   TSH 1.10 07/22/2018     Elevated blood pressure reading in office without diagnosis of hypertension She has no history of hypertension but had several elevated readings in 2018. Marland Kitchen  She has checked  her pressures at home and all have been < 120/70.  Renal function is excellent   Lab Results  Component Value Date   CREATININE 0.58 07/22/2018     Osteoporosis Prior DEXA scan has been requested .  She has deferred therapy because she is taking HRT. If there has been progression,  Will recommend more definitive therapy   Breast cancer screening Annual mammograms recommended based on discussion today with patient's concerns addressed   Colon cancer screening Records of prior colonoscopy have not been received and have again been requested of patient there is no family history and no personal history of polyps     I discussed the assessment and treatment plan with the patient. The patient was provided an opportunity to ask questions and all were answered. The patient agreed with the plan and demonstrated an understanding of the instructions.   The patient was advised to call back or seek an in-person evaluation if the symptoms worsen or if the condition fails to improve as anticipated.  I provided 25 minutes of non-face-to-face time during this encounter counseling patient on  health maintenance issues,  COVID 19 PRECAUTIONS,  Use of NSAIDS,  And monitoring of blood pressure .   [name], MD

## 2018-07-25 NOTE — Patient Instructions (Signed)
Your annual mammogram has been ordered.  You are encouraged (required) to call to make your appointment at [facility]  Please send me any available records of your last colonoscopy and bone density tests  Doxycycline to be started if you develop a skin reaction to a tick bite,  Or a fever and headache after a tick bite. AND LET ME KNOW

## 2018-07-28 ENCOUNTER — Other Ambulatory Visit: Payer: Self-pay | Admitting: Internal Medicine

## 2018-07-28 DIAGNOSIS — Z1239 Encounter for other screening for malignant neoplasm of breast: Secondary | ICD-10-CM | POA: Insufficient documentation

## 2018-07-28 DIAGNOSIS — Z1211 Encounter for screening for malignant neoplasm of colon: Secondary | ICD-10-CM | POA: Insufficient documentation

## 2018-07-28 DIAGNOSIS — M810 Age-related osteoporosis without current pathological fracture: Secondary | ICD-10-CM | POA: Insufficient documentation

## 2018-07-28 DIAGNOSIS — Z13820 Encounter for screening for osteoporosis: Secondary | ICD-10-CM | POA: Insufficient documentation

## 2018-07-28 NOTE — Assessment & Plan Note (Signed)
She continues to benefit from HRT and is using one patch every 2 weeks for sleep issues and atrophic vaginitis. She is aware of the potential long term  risks

## 2018-07-28 NOTE — Assessment & Plan Note (Signed)
Records of prior colonoscopy have not been received and have again been requested of patient there is no family history and no personal history of polyps

## 2018-07-28 NOTE — Assessment & Plan Note (Signed)
Annual mammograms recommended based on discussion today with patient's concerns addressed

## 2018-07-28 NOTE — Assessment & Plan Note (Signed)
Prior DEXA scan has been requested .  She has deferred therapy because she is taking HRT. If there has been progression,  Will recommend more definitive therapy

## 2018-07-28 NOTE — Assessment & Plan Note (Signed)
She has no history of hypertension but had several elevated readings in 2018. Marland Kitchen  She has checked  her pressures at home and all have been < 120/70.  Renal function is excellent   Lab Results  Component Value Date   CREATININE 0.58 07/22/2018

## 2018-07-28 NOTE — Assessment & Plan Note (Addendum)
TSH is therapeutic,  Continue current levothyoxine dose,  Brand name only,  Refilled. Recheck annually per patient preference .  Lab Results  Component Value Date   TSH 1.10 07/22/2018

## 2018-08-01 ENCOUNTER — Other Ambulatory Visit: Payer: Self-pay | Admitting: Internal Medicine

## 2018-08-01 DIAGNOSIS — Z1211 Encounter for screening for malignant neoplasm of colon: Secondary | ICD-10-CM

## 2018-08-01 NOTE — Progress Notes (Signed)
ar

## 2018-08-06 DIAGNOSIS — R69 Illness, unspecified: Secondary | ICD-10-CM | POA: Diagnosis not present

## 2018-08-12 ENCOUNTER — Other Ambulatory Visit: Payer: Self-pay | Admitting: Internal Medicine

## 2018-08-13 ENCOUNTER — Other Ambulatory Visit: Payer: Self-pay

## 2018-08-14 ENCOUNTER — Encounter: Payer: Self-pay | Admitting: Internal Medicine

## 2018-08-14 MED ORDER — MELOXICAM 15 MG PO TABS: 15.0000 mg | ORAL_TABLET | Freq: Every day | ORAL | 1 refills | Status: DC

## 2018-09-18 DIAGNOSIS — D485 Neoplasm of uncertain behavior of skin: Secondary | ICD-10-CM | POA: Diagnosis not present

## 2018-09-18 DIAGNOSIS — L578 Other skin changes due to chronic exposure to nonionizing radiation: Secondary | ICD-10-CM | POA: Diagnosis not present

## 2018-09-18 DIAGNOSIS — L821 Other seborrheic keratosis: Secondary | ICD-10-CM | POA: Diagnosis not present

## 2018-09-18 DIAGNOSIS — Z872 Personal history of diseases of the skin and subcutaneous tissue: Secondary | ICD-10-CM | POA: Diagnosis not present

## 2018-09-18 DIAGNOSIS — C44319 Basal cell carcinoma of skin of other parts of face: Secondary | ICD-10-CM | POA: Diagnosis not present

## 2018-09-18 DIAGNOSIS — B078 Other viral warts: Secondary | ICD-10-CM | POA: Diagnosis not present

## 2018-09-22 DIAGNOSIS — C801 Malignant (primary) neoplasm, unspecified: Secondary | ICD-10-CM

## 2018-09-22 HISTORY — DX: Malignant (primary) neoplasm, unspecified: C80.1

## 2018-09-23 ENCOUNTER — Other Ambulatory Visit: Payer: Self-pay | Admitting: Internal Medicine

## 2018-09-23 ENCOUNTER — Other Ambulatory Visit: Payer: Self-pay

## 2018-09-23 ENCOUNTER — Ambulatory Visit
Admission: RE | Admit: 2018-09-23 | Discharge: 2018-09-23 | Disposition: A | Discharge status: Home / Self Care | Payer: Medicare HMO | Source: Ambulatory Visit | Attending: Internal Medicine | Admitting: Internal Medicine

## 2018-09-23 DIAGNOSIS — R928 Other abnormal and inconclusive findings on diagnostic imaging of breast: Secondary | ICD-10-CM

## 2018-09-23 DIAGNOSIS — N6489 Other specified disorders of breast: Secondary | ICD-10-CM

## 2018-09-23 DIAGNOSIS — Z1231 Encounter for screening mammogram for malignant neoplasm of breast: Secondary | ICD-10-CM | POA: Insufficient documentation

## 2018-09-23 DIAGNOSIS — Z1239 Encounter for other screening for malignant neoplasm of breast: Secondary | ICD-10-CM

## 2018-09-30 DIAGNOSIS — C4491 Basal cell carcinoma of skin, unspecified: Secondary | ICD-10-CM | POA: Diagnosis not present

## 2018-10-03 ENCOUNTER — Other Ambulatory Visit: Payer: Self-pay

## 2018-10-03 ENCOUNTER — Ambulatory Visit
Admission: RE | Admit: 2018-10-03 | Discharge: 2018-10-03 | Disposition: A | Discharge status: Home / Self Care | Payer: Medicare HMO | Source: Ambulatory Visit | Attending: Internal Medicine | Admitting: Internal Medicine

## 2018-10-03 DIAGNOSIS — R928 Other abnormal and inconclusive findings on diagnostic imaging of breast: Secondary | ICD-10-CM | POA: Diagnosis not present

## 2018-10-03 DIAGNOSIS — N6489 Other specified disorders of breast: Secondary | ICD-10-CM | POA: Insufficient documentation

## 2018-10-06 ENCOUNTER — Other Ambulatory Visit: Payer: Self-pay

## 2018-10-06 ENCOUNTER — Ambulatory Visit (INDEPENDENT_AMBULATORY_CARE_PROVIDER_SITE_OTHER): Payer: Medicare HMO

## 2018-10-06 DIAGNOSIS — Z1159 Encounter for screening for other viral diseases: Secondary | ICD-10-CM | POA: Diagnosis not present

## 2018-10-06 DIAGNOSIS — Z0000 Encounter for general adult medical examination without abnormal findings: Secondary | ICD-10-CM | POA: Diagnosis not present

## 2018-10-06 NOTE — Patient Instructions (Addendum)
  Kellie Roberson , Thank you for taking time to come for your Medicare Wellness Visit. I appreciate your ongoing commitment to your health goals. Please review the following plan we discussed and let me know if I can assist you in the future.   These are the goals we discussed: Goals    . Follow up with Primary Care Provider     As needed       This is a list of the screening recommended for you and due dates:  Health Maintenance  Topic Date Due  .  Hepatitis C: One time screening is recommended by Center for Disease Control  (CDC) for  adults born from [birth year] through 1963.   1945/11/30  . Colon Cancer Screening  10/09/2017  . Flu Shot  10/04/2018  . Tetanus Vaccine  03/05/2020  . Mammogram  09/22/2020  . DEXA scan (bone density measurement)  Completed  . Pneumonia vaccines  Discontinued

## 2018-10-06 NOTE — Progress Notes (Signed)
Subjective:   Kellie Roberson is a 73 y.o. female who presents for an Initial Medicare Annual Wellness Visit.  Review of Systems    No ROS.  Medicare Wellness Virtual Visit.  Visual/audio telehealth visit, UTA vital signs.   See social history for additional risk factors.    Cardiac Risk Factors include: advanced age (>31men, >64 women)     Objective:    Today's Vitals   There is no height or weight on file to calculate BMI.  Advanced Directives 10/06/2018  Does Patient Have a Medical Advance Directive? Yes  Type of Advance Directive Living will;Healthcare Power of Attorney  Does patient want to make changes to medical advance directive? No - Patient declined  Copy of [place] in Chart? No - copy requested    Current Medications (verified) Outpatient Encounter Medications as of 10/06/2018  Medication Sig  . Ascorbic Acid (VITAMIN C) 100 MG tablet Take 100 mg by mouth daily as needed.  . doxycycline (VIBRA-TABS) 100 MG tablet Take 1 tablet (100 mg total) by mouth 2 (two) times daily.  Marland Kitchen estradiol (VIVELLE-DOT) 0.025 MG/24HR Please specify directions, refills and quantity  . meloxicam (MOBIC) 15 MG tablet Take 1 tablet (15 mg total) by mouth daily.  . Probiotic Product (PROBIOTIC-10 PO) Take by mouth daily.  Marland Kitchen SYNTHROID 100 MCG tablet TAKE 1 TABLET (100 MCG TOTAL) BY MOUTH DAILY. NAME BRAND ONLY   No facility-administered encounter medications on file as of 10/06/2018.     Allergies (verified) Patient has no known allergies.   History: Past Medical History:  Diagnosis Date  . Cancer ([place]) 09/22/2018   skin ca-basal cell   Past Surgical History:  Procedure Laterality Date  . ABDOMINAL HYSTERECTOMY  2003   total-had some structural damage after 2nd pregnancy  . THYROIDECTOMY  1975   was hyperthyroid   Family History  Problem Relation Age of Onset  . Alzheimer's disease Mother   . Hypertension Father   . Glaucoma Father   . Thyroid disease Sister    . Mitral valve prolapse Sister   . Breast cancer Neg Hx    Social History   Socioeconomic History  . Marital status: Married    Spouse name: Not on file  . Number of children: Not on file  . Years of education: Not on file  . Highest education level: Not on file  Occupational History  . Not on file  Social Needs  . Financial resource strain: Not hard at all  . Food insecurity    Worry: Never true    Inability: Never true  . Transportation needs    Medical: No    Non-medical: No  Tobacco Use  . Smoking status: Never Smoker  . Smokeless tobacco: Never Used  Substance and Sexual Activity  . Alcohol use: Yes  . Drug use: No  . Sexual activity: Not on file  Lifestyle  . Physical activity    Days per week: 5 days    Minutes per session: 60 min  . Stress: Not at all  Relationships  . Social Herbalist on phone: Not on file    Gets together: Not on file    Attends religious service: Not on file    Active member of club or organization: Not on file    Attends meetings of clubs or organizations: Not on file    Relationship status: Not on file  Other Topics Concern  . Not on file  Social  History Narrative  . Not on file    Tobacco Counseling Counseling given: Not Answered   Clinical Intake:  Pre-visit preparation completed: Yes        Diabetes: No  How often do you need to have someone help you when you read instructions, pamphlets, or other written materials from your doctor or pharmacy?: 1 - Never  Interpreter Needed?: No      Activities of Daily Living In your present state of health, do you have any difficulty performing the following activities: 10/06/2018  Hearing? N  Vision? N  Difficulty concentrating or making decisions? N  Walking or climbing stairs? N  Dressing or bathing? N  Doing errands, shopping? N  Preparing Food and eating ? N  Using the Toilet? N  In the past six months, have you accidently leaked urine? N  Do you have  problems with loss of bowel control? N  Managing your Medications? N  Managing your Finances? N  Housekeeping or managing your Housekeeping? N  Some recent data might be hidden     Immunizations and Health Maintenance Immunization History  Administered Date(s) Administered  . Pneumococcal Conjugate-13 11/04/2015  . Pneumococcal Polysaccharide-23 07/11/2017  . Td 03/05/2010   Health Maintenance Due  Topic Date Due  . Hepatitis C Screening  09-Jul-1945  . COLONOSCOPY  10/09/2017  . INFLUENZA VACCINE  10/04/2018    Patient Care Team: [name], MD as PCP - General (Internal Medicine)  Indicate any recent Medical Services you may have received from other than [facility] providers in the past year (date may be approximate).     Assessment:   This is a routine wellness examination for Kellie Roberson.  I connected with patient 10/06/18 at  1:00 PM EDT by a video/audio enabled telemedicine application and verified that I am speaking with the correct person using two identifiers. Patient stated full name and DOB. Patient gave permission to continue with virtual visit. Patient's location was at home and Nurse's location was at [place] office.   Health Screenings  Mammogram - 09/2018 Colonoscopy - 10/2007 Cologuard- discussed; plans to complete once received. Bone Density - 10/2015 Glaucoma -none Hearing -demonstrates normal hearing during visit. TSH- 07/2018 Hepatitis C screening- consent given. Cholesterol - 07/2018 Dental- visits every 6 months Vision- visits within the last 12 months.  Social  Alcohol intake - yes      Smoking history- never   Smokers in home? none Illicit drug use? none Exercise - outdoor gardening, landscaping, bike ride, swimming, hiking, online weight lifting class twice a week  Diet - vegetables, fruits, lean meats Sexually Active -not currently BMI- discussed the importance of a healthy diet, water intake and the benefits of aerobic exercise.  Educational  material provided.   Safety  Patient feels safe at home- yes Patient does have smoke detectors at home- yes Patient does wear sunscreen or protective clothing when in direct sunlight -yes Patient does wear seat belt when in a moving vehicle -yes Patient drives- yes  [id number] precautions and sickness symptoms discussed.   Activities of Daily Living Patient denies needing assistance with: driving, household chores, feeding themselves, getting from bed to chair, getting to the toilet, bathing/showering, dressing, managing money, or preparing meals.  No new identified risk were noted.    Depression Screen Patient denies losing interest in daily life, feeling hopeless, or crying easily over simple problems.   Medication-taking as directed and without issues.   Fall Screen Patient denies being afraid of falling or falling  in the last year.   Memory Screen Patient is alert.  Patient denies difficulty focusing, concentrating or misplacing items. Correctly identified the president of the [place], season and recall. Patient likes to read scholarly journals and help her grandchildren with school lessons for brain stimulation.  Immunizations The following Immunizations were discussed: Influenza, shingles, pneumonia, and tetanus.   Other Providers Patient Care Team: [name], MD as PCP - General (Internal Medicine)  Hearing/Vision screen  Hearing Screening   125Hz  250Hz  500Hz  1000Hz  2000Hz  3000Hz  4000Hz  6000Hz  8000Hz   Right ear:           Left ear:           Comments: Patient is able to hear conversational tones without difficulty.  No issues reported.  Vision Screening Comments: Wears corrective lenses Visual acuity not assessed, virtual visit.  They have seen their ophthalmologist in the last 12 months.     Dietary issues and exercise activities discussed: Current Exercise Habits: Home exercise routine, Type of exercise: strength  training/weights;walking;calisthenics(swimming), Time (Minutes): 30, Frequency (Times/Week): 5, Weekly Exercise (Minutes/Week): 150, Intensity: Moderate  Goals    . Follow up with Primary Care Provider     As needed      Depression Screen PHQ 2/9 Scores 10/06/2018 07/25/2018 04/23/2016  PHQ - 2 Score 0 0 0  PHQ- 9 Score - 0 -    Fall Risk Fall Risk  10/06/2018 07/25/2018 04/23/2016  Falls in the past year? 0 0 No  Is the patient's home free of loose throw rugs in walkways, pet beds, electrical cords, etc?  yes      Grab bars in the bathroom? yes      Handrails on the stairs?  yes      Adequate lighting?  yes  Cognitive Function:     6CIT Screen 10/06/2018  What Year? 0 points  What month? 0 points  What time? 0 points  Count back from 20 0 points  Months in reverse 0 points  Repeat phrase 0 points  Total Score 0    Screening Tests Health Maintenance  Topic Date Due  . Hepatitis C Screening  November 19, 1945  . COLONOSCOPY  10/09/2017  . INFLUENZA VACCINE  10/04/2018  . TETANUS/TDAP  03/05/2020  . MAMMOGRAM  09/22/2020  . DEXA SCAN  Completed  . PNA vac Low Risk Adult  Discontinued     Plan:    End of life planning; Advance aging; Advanced directives discussed.  Copy of current HCPOA/Living Will requested.    I have personally reviewed and noted the following in the patient's chart:   . Medical and social history . Use of alcohol, tobacco or illicit drugs  . Current medications and supplements . Functional ability and status . Nutritional status . Physical activity . Advanced directives . List of other physicians . Hospitalizations, surgeries, and ER visits in previous 12 months . Vitals . Screenings to include cognitive, depression, and falls . Referrals and appointments  In addition, I have reviewed and discussed with patient certain preventive protocols, quality metrics, and best practice recommendations. A written personalized care plan for preventive services as  well as general preventive health recommendations were provided to patient.     Varney Biles, LPN   07/06/6142

## 2018-10-08 DIAGNOSIS — C44319 Basal cell carcinoma of skin of other parts of face: Secondary | ICD-10-CM | POA: Diagnosis not present

## 2018-10-27 DIAGNOSIS — Z48817 Encounter for surgical aftercare following surgery on the skin and subcutaneous tissue: Secondary | ICD-10-CM | POA: Diagnosis not present

## 2018-10-27 DIAGNOSIS — L03211 Cellulitis of face: Secondary | ICD-10-CM | POA: Diagnosis not present

## 2018-10-28 ENCOUNTER — Other Ambulatory Visit: Payer: Self-pay | Admitting: Internal Medicine

## 2018-10-28 ENCOUNTER — Other Ambulatory Visit: Payer: Self-pay

## 2018-10-28 MED ORDER — ESTRADIOL 0.025 MG/24HR TD PTTW: MEDICATED_PATCH | TRANSDERMAL | 3 refills | Status: DC

## 2018-10-30 DIAGNOSIS — H02839 Dermatochalasis of unspecified eye, unspecified eyelid: Secondary | ICD-10-CM | POA: Diagnosis not present

## 2018-10-30 DIAGNOSIS — H353131 Nonexudative age-related macular degeneration, bilateral, early dry stage: Secondary | ICD-10-CM | POA: Diagnosis not present

## 2018-10-30 DIAGNOSIS — E7800 Pure hypercholesterolemia, unspecified: Secondary | ICD-10-CM | POA: Diagnosis not present

## 2018-10-31 MED ORDER — CLIMARA 0.025 MG/24HR TD PTWK: MEDICATED_PATCH | TRANSDERMAL | 3 refills | Status: DC

## 2018-11-15 DIAGNOSIS — R69 Illness, unspecified: Secondary | ICD-10-CM | POA: Diagnosis not present

## 2018-11-17 ENCOUNTER — Other Ambulatory Visit: Payer: Self-pay

## 2018-11-17 ENCOUNTER — Ambulatory Visit (INDEPENDENT_AMBULATORY_CARE_PROVIDER_SITE_OTHER): Payer: Medicare HMO | Admitting: Internal Medicine

## 2018-11-17 ENCOUNTER — Encounter: Payer: Self-pay | Admitting: Internal Medicine

## 2018-11-17 DIAGNOSIS — H00014 Hordeolum externum left upper eyelid: Secondary | ICD-10-CM

## 2018-11-17 DIAGNOSIS — H00019 Hordeolum externum unspecified eye, unspecified eyelid: Secondary | ICD-10-CM | POA: Insufficient documentation

## 2018-11-17 MED ORDER — BACITRACIN-POLYMYXIN B 500-10000 UNIT/GM OP OINT: 1.0000 "application " | TOPICAL_OINTMENT | Freq: Four times a day (QID) | OPHTHALMIC | 0 refills | Status: DC

## 2018-11-17 NOTE — Progress Notes (Signed)
Virtual Visit via [email or web address]  This visit type was conducted due to national recommendations for restrictions regarding the COVID-19 pandemic (e.g. social distancing).  This format is felt to be most appropriate for this patient at this time.  All issues noted in this document were discussed and addressed.  No physical exam was performed (except for noted visual exam findings with Video Visits).   I connected with@ on 11/17/18 at  8:30 AM EDT by a video enabled telemedicine applicationand verified that I am speaking with the correct person using two identifiers. Location patient: home Location provider: work or home office Persons participating in the virtual visit: patient, provider  I discussed the limitations, risks, security and privacy concerns of performing an evaluation and management service by telephone and the availability of in person appointments. I also discussed with the patient that there may be a patient responsible charge related to this service. The patient expressed understanding and agreed to proceed.  Reason for visit: painful stye  HPI:  73 yr old female presents with painful stye involving medial side of left upper eye lid.  Swelling started on Friday Sept 11 .  Has been using warm compresses  On  it round the clock with no change.  Recent Moh's procedure to scalp,  Resolution of  of wound Complicated by infection , presumed Staph. treated with cephalexin ,  Followed by doxycycline ,  Ending August 31 . Worried about any more oral antibiotics.  No diarrhea thus far.  Daily use of a probiotic advised for 3 weeks.    ROS: See pertinent positives and negatives per HPI.  Past Medical History:  Diagnosis Date  . Cancer ([place]) 09/22/2018   skin ca-basal cell    Past Surgical History:  Procedure Laterality Date  . ABDOMINAL HYSTERECTOMY  2003   total-had some structural damage after 2nd pregnancy  . THYROIDECTOMY  1975   was hyperthyroid    Family History  Problem  Relation Age of Onset  . Alzheimer's disease Mother   . Hypertension Father   . Glaucoma Father   . Thyroid disease Sister   . Mitral valve prolapse Sister   . Breast cancer Neg Hx     SOCIAL HX:  reports that she has never smoked. She has never used smokeless tobacco. She reports current alcohol use. She reports that she does not use drugs.   Current Outpatient Medications:  .  Ascorbic Acid (VITAMIN C) 100 MG tablet, Take 100 mg by mouth daily as needed., Disp: , Rfl:  .  CLIMARA 0.025 MG/24HR patch, NAME BRAND ONLY.  PATIENT WILL PAY OUT OF POCKET, Disp: 12 patch, Rfl: 3 .  meloxicam (MOBIC) 15 MG tablet, Take 1 tablet (15 mg total) by mouth daily., Disp: 90 tablet, Rfl: 1 .  Probiotic Product (PROBIOTIC-10 PO), Take by mouth daily., Disp: , Rfl:  .  SYNTHROID 100 MCG tablet, TAKE 1 TABLET (100 MCG TOTAL) BY MOUTH DAILY. NAME BRAND ONLY, Disp: 90 tablet, Rfl: 1 .  bacitracin-polymyxin b (POLYSPORIN) ophthalmic ointment, Place 1 application into the left eye 4 (four) times daily., Disp: 3.5 g, Rfl: 0  EXAM:  VITALS per patient if applicable:  GENERAL: alert, oriented, appears well and in no acute distress  HEENT: atraumatic, conjunttiva clear, no obvious abnormalities on inspection of external nose and ears.  Left upper eyelid with large red style on medial side   NECK: normal movements of the head and neck  LUNGS: on inspection no signs of respiratory distress, breathing  rate appears normal, no obvious gross SOB, gasping or wheezing  CV: no obvious cyanosis  MS: moves all visible extremities without noticeable abnormality  PSYCH/NEURO: pleasant and cooperative, no obvious depression or anxiety, speech and thought processing grossly intact  ASSESSMENT AND PLAN:  Discussed the following assessment and plan:  Hordeolum externum of left upper eyelid  Hordeolum externum (stye) Topical bacitracin qid.  Advised to discard all previously  used mascara's , moisturizers etc that  came in contact with areof cncern     I discussed the assessment and treatment plan with the patient. The patient was provided an opportunity to ask questions and all were answered. The patient agreed with the plan and demonstrated an understanding of the instructions.   The patient was advised to call back or seek an in-person evaluation if the symptoms worsen or if the condition fails to improve as anticipated.  I provided 15 minutes of non-face-to-face time during this encounter.   [name], MD

## 2018-11-17 NOTE — Assessment & Plan Note (Signed)
Topical bacitracin qid.  Advised to discard all previously  used mascara's , moisturizers etc that came in contact with areof cncern

## 2018-11-18 ENCOUNTER — Other Ambulatory Visit: Payer: Self-pay | Admitting: Internal Medicine

## 2018-11-18 NOTE — Telephone Encounter (Signed)
Requested medication (s) are due for refill today: yes  Requested medication (s) are on the active medication list: yes  Last refill: 11/17/2018  Future visit scheduled: yes  Notes to clinic: Patient needs presciption for anitbiotic. Patient was instructed to callback after 24 hours of using bacitracin-polymyxin b (POLYSPORIN) ophthalmic ointment if it did not work. Patient would like a callback once medication has been sent to pharmacy   Requested Prescriptions  Pending Prescriptions Disp Refills   bacitracin-polymyxin b (POLYSPORIN) ophthalmic ointment 3.5 g 0    Sig: Place 1 application into the left eye 4 (four) times daily.     Off-Protocol Failed - 11/18/2018 10:47 AM      Failed - Medication not assigned to a protocol, review manually.      Passed - Valid encounter within last 12 months    Recent Outpatient Visits          Yesterday Hordeolum externum of left upper eyelid   [facility] [place] [name], MD   3 months ago Breast cancer screening   [place] [name], MD   2 years ago Post-menopause on HRT (hormone replacement therapy)   [place] [name], MD   3 years ago Postoperative hypothyroidism   [facility] [place] [name], MD      Future Appointments            In 10 months O'Brien-Blaney, Bryson Corona, LPN [place], [place]

## 2018-11-18 NOTE — Telephone Encounter (Signed)
Copied from [place] [id number]. Topic: Quick Communication - Rx Refill/Question >> Nov 18, 2018 10:34 AM Rainey Pines A wrote: Medication: Patient needs presciption for anitbiotic. Patient was instructed to callback after 24 hours of using bacitracin-polymyxin b (POLYSPORIN) ophthalmic ointment if it did not work. Patient would like a callback once medication has been sent to pharmacy.  Has the patient contacted their pharmacy? Yes (Agent: If no, request that the patient contact the pharmacy for the refill.) (Agent: If yes, when and what did the pharmacy advise?)Contact PCP  Preferred Pharmacy (with phone number or street name): [id number] - [place], [place] [address] [address] [phone] (Phone) [phone] (Fax)    Agent: Please be advised that RX refills may take up to 3 business days. We ask that you follow-up with your pharmacy.

## 2018-11-19 ENCOUNTER — Telehealth: Payer: Self-pay | Admitting: Internal Medicine

## 2018-11-19 ENCOUNTER — Other Ambulatory Visit: Payer: Self-pay

## 2018-11-19 ENCOUNTER — Other Ambulatory Visit: Payer: Self-pay | Admitting: Internal Medicine

## 2018-11-19 ENCOUNTER — Ambulatory Visit
Admission: EM | Admit: 2018-11-19 | Discharge: 2018-11-19 | Disposition: A | Discharge status: Home / Self Care | Payer: Medicare HMO | Attending: Family Medicine | Admitting: Family Medicine

## 2018-11-19 ENCOUNTER — Encounter: Payer: Self-pay | Admitting: Emergency Medicine

## 2018-11-19 DIAGNOSIS — H00014 Hordeolum externum left upper eyelid: Secondary | ICD-10-CM | POA: Diagnosis not present

## 2018-11-19 DIAGNOSIS — H0014 Chalazion left upper eyelid: Secondary | ICD-10-CM | POA: Diagnosis not present

## 2018-11-19 HISTORY — DX: Disorder of thyroid, unspecified: E07.9

## 2018-11-19 MED ORDER — CEFDINIR 300 MG PO CAPS: 300.0000 mg | ORAL_CAPSULE | Freq: Two times a day (BID) | ORAL | 0 refills | Status: DC

## 2018-11-19 NOTE — Discharge Instructions (Signed)
Call your eye doctor for an appt (for possible drainage).  Lots of warm compresses.  Take care  Dr. Lacinda Axon

## 2018-11-19 NOTE — Telephone Encounter (Signed)
Left a detailed message letting pt know that the antibiotics have been called in.

## 2018-11-19 NOTE — Addendum Note (Signed)
Addended by: [name] on: 11/19/2018 12:07 PM   Modules accepted: Orders

## 2018-11-19 NOTE — Telephone Encounter (Signed)
Dr. Derrel Nip, just an FYI.  I scheduled with at [facility]. I called her to let her know and she stated that she was just seen by her ophthalmologist since Urgent Care told her to contact them to be seen. She also states that she did not know that you called in an antibiotic for her until [facility] called [facility]. She stated that she will not be picking that up b/c her ophthalmalgist called her in other medications for her eye.   I will close her referral that you entered for her today.  Thx, melissa

## 2018-11-19 NOTE — ED Triage Notes (Signed)
Patient in today c/o stye on her left eyelid since the weekend. Patient had an e-visit 11/17/18 with PCP and was given abx eye ointment to try for 1 day (due to patient being on lots of antibiotics a lot recently) without relief.

## 2018-11-19 NOTE — Telephone Encounter (Signed)
This patient needs to be called.  Whoever she spoke with (a?" "chaquitsee my chart message) NEVER RELAYED ANY MESSAGE TO ME  (Is it in the pool from yesterday? ) despite telling her it would be handled as a priority.  Now she has been to the ER and still not given an antibiotic  I am calling them in right  now,  But she may have to have it incised and drained by her ophthalmologist as recommended by Dr Lacinda Axon.

## 2018-11-19 NOTE — ED Provider Notes (Signed)
[facility]    CSN: [id number] Arrival date & time: 11/19/18  [id number]  History   Chief Complaint Chief Complaint  Patient presents with  . Stye    left eye   HPI  73 year old female presents with the above complaint.  Patient states that she developed a stye on her left upper eyelid over the weekend.  She had an ED visit on 9/14 and was prescribed some topical antibiotics.  She states that she has used antibiotic and has used warm compresses without relief.  The area is quite painful, red, and swollen.  No known inciting factor.  No relieving factors.  She has not contacted her ophthalmologist.  No other associated symptoms.  No other complaints.  PMH, Surgical Hx, Family Hx, Social History reviewed and updated as below.  Past Medical History:  Diagnosis Date  . Cancer ([place]) 09/22/2018   skin ca-basal cell  . Thyroid disease    Patient Active Problem List   Diagnosis Date Noted  . Hordeolum externum (stye) 11/17/2018  . Osteoporosis 07/28/2018  . Breast cancer screening 07/28/2018  . Colon cancer screening 07/28/2018  . Elevated blood pressure reading in office without diagnosis of hypertension 04/23/2016  . S/P laparoscopic supracervical hysterectomy 10/23/2015  . S/P BSO (bilateral salpingo-oophorectomy) 10/23/2015  . Post-menopause on HRT (hormone replacement therapy) 10/23/2015  . Postoperative hypothyroidism 10/23/2015   Past Surgical History:  Procedure Laterality Date  . ABDOMINAL HYSTERECTOMY  2003   total-had some structural damage after 2nd pregnancy  . THYROIDECTOMY  1975   was hyperthyroid    OB History   No obstetric history on file.    Home Medications    Prior to Admission medications   Medication Sig Start Date End Date Taking? Authorizing Provider  Ascorbic Acid (VITAMIN C) 100 MG tablet Take 100 mg by mouth daily as needed.   Yes [provider]  bacitracin-polymyxin b (POLYSPORIN) ophthalmic ointment Place 1 application into  the left eye 4 (four) times daily. 11/17/18  Yes [name], MD  CLIMARA 0.025 MG/24HR patch NAME BRAND ONLY.  PATIENT WILL PAY OUT OF POCKET 10/31/18  Yes [name], MD  meloxicam (MOBIC) 15 MG tablet Take 1 tablet (15 mg total) by mouth daily. 08/14/18  Yes [name], MD  Probiotic Product (PROBIOTIC-10 PO) Take by mouth daily.   Yes [provider]  SYNTHROID 100 MCG tablet TAKE 1 TABLET (100 MCG TOTAL) BY MOUTH DAILY. NAME BRAND ONLY 07/25/18  Yes [name], MD    Family History Family History  Problem Relation Age of Onset  . Alzheimer's disease Mother   . Hypertension Father   . Glaucoma Father   . Thyroid disease Sister   . Mitral valve prolapse Sister   . Breast cancer Neg Hx     Social History Social History   Tobacco Use  . Smoking status: Never Smoker  . Smokeless tobacco: Never Used  Substance Use Topics  . Alcohol use: Yes    Comment: social  . Drug use: No    Allergies   Patient has no known allergies.   Review of Systems Review of Systems  Constitutional: Negative.   Eyes:       Stye.   Physical Exam Triage Vital Signs ED Triage Vitals [11/19/18 0841]  Enc Vitals Group     BP (!) 159/97     Pulse Rate 72     Resp 18     Temp 97.9 F (36.6 C)  Temp Source Oral     SpO2 99 %     Weight      Height      Head Circumference      Peak Flow      Pain Score 6     Pain Loc      Pain Edu?      Excl. in [place]?    Updated Vital Signs BP (!) 159/97 (BP Location: Left Arm)   Pulse 72   Temp 97.9 F (36.6 C) (Oral)   Resp 18   SpO2 99%   Visual Acuity Right Eye Distance: 20/25(corrected) Left Eye Distance: 20/50(corrected) Bilateral Distance: 20/25(corrected)  Right Eye Near:   Left Eye Near:    Bilateral Near:     Physical Exam Vitals signs and nursing note reviewed.  Constitutional:      General: She is not in acute distress.    Appearance: Normal appearance. She is not ill-appearing.  HENT:     Head:  Normocephalic and atraumatic.  Eyes:     Conjunctiva/sclera: Conjunctivae normal.     Comments: Large stye noted on the left upper eyelid.  Tender to palpation.  No discharge.  Cardiovascular:     Rate and Rhythm: Normal rate and regular rhythm.     Heart sounds: No murmur.  Pulmonary:     Effort: Pulmonary effort is normal.     Breath sounds: Normal breath sounds. No wheezing, rhonchi or rales.  Neurological:     Mental Status: She is alert.  Psychiatric:        Mood and Affect: Mood normal.        Behavior: Behavior normal.    UC Treatments / Results  Labs (all labs ordered are listed, but only abnormal results are displayed) Labs Reviewed - No data to display  EKG   Radiology No results found.  Procedures Procedures (including critical care time)  Medications Ordered in UC Medications - No data to display  Initial Impression / Assessment and Plan / UC Course  I have reviewed the triage vital signs and the nursing notes.  Pertinent labs & imaging results that were available during my care of the patient were reviewed by me and considered in my medical decision making (see chart for details).    73 year old female presents with stye.  I advised lots of warm compresses.  Advised to contact her ophthalmologist for discussion about possible incision and drainage.  Final Clinical Impressions(s) / UC Diagnoses   Final diagnoses:  Hordeolum externum of left upper eyelid     Discharge Instructions     Call your eye doctor for an appt (for possible drainage).  Lots of warm compresses.  Take care  Dr. Lacinda Axon    ED Prescriptions    None     Controlled Substance Prescriptions [place] Controlled Substance Registry consulted? Not Applicable   Coral Spikes, [place] 11/19/18 [phone]

## 2018-11-21 NOTE — Telephone Encounter (Signed)
Patient has already seen Ophthalmology and was  Prescribed ABX and drops with steroid and ABX I have up dated chart.

## 2018-11-21 NOTE — Telephone Encounter (Signed)
Kellie Roberson did not read her mychart message  (see past exchanges in chart.  ) can you Please ask let her know that   IF MYCHART IS ACTIVATED,  IT IS THE SPEEDIEST WAY FOR ME TO RESPOND TO PATIENTS (NO WAITING FOR THE RN TO CALL HER BACK WHEN SHE IS ONE OF 30 MESSAGES PER DAY

## 2018-12-16 NOTE — Telephone Encounter (Signed)
This refill request is 76 weeks old.  Can you look into why it was just sent to me? Has it been sitting in the pool for a month?  Patient needs to be called to see if she still needs. That should have been done by donna when she received it

## 2018-12-19 NOTE — Telephone Encounter (Signed)
I spoke with and investigated this I have corrected the [facility] pool and researched patient saw Opthalmology for this on 11/19/18 and was prescribed a different medication. Patient is better.

## 2018-12-20 ENCOUNTER — Emergency Department: Payer: Medicare HMO

## 2018-12-20 ENCOUNTER — Encounter: Payer: Self-pay | Admitting: Emergency Medicine

## 2018-12-20 ENCOUNTER — Other Ambulatory Visit: Payer: Self-pay

## 2018-12-20 ENCOUNTER — Observation Stay
Admission: EM | Admit: 2018-12-20 | Discharge: 2018-12-20 | Disposition: A | Discharge status: Home / Self Care | Payer: Medicare HMO | Attending: Internal Medicine | Admitting: Internal Medicine

## 2018-12-20 DIAGNOSIS — Z8349 Family history of other endocrine, nutritional and metabolic diseases: Secondary | ICD-10-CM | POA: Diagnosis not present

## 2018-12-20 DIAGNOSIS — E890 Postprocedural hypothyroidism: Secondary | ICD-10-CM | POA: Diagnosis not present

## 2018-12-20 DIAGNOSIS — R002 Palpitations: Secondary | ICD-10-CM | POA: Diagnosis not present

## 2018-12-20 DIAGNOSIS — I493 Ventricular premature depolarization: Secondary | ICD-10-CM

## 2018-12-20 DIAGNOSIS — Z20828 Contact with and (suspected) exposure to other viral communicable diseases: Secondary | ICD-10-CM | POA: Insufficient documentation

## 2018-12-20 DIAGNOSIS — Z791 Long term (current) use of non-steroidal anti-inflammatories (NSAID): Secondary | ICD-10-CM | POA: Insufficient documentation

## 2018-12-20 DIAGNOSIS — R000 Tachycardia, unspecified: Secondary | ICD-10-CM | POA: Diagnosis not present

## 2018-12-20 DIAGNOSIS — Z85828 Personal history of other malignant neoplasm of skin: Secondary | ICD-10-CM | POA: Diagnosis not present

## 2018-12-20 DIAGNOSIS — R778 Other specified abnormalities of plasma proteins: Secondary | ICD-10-CM

## 2018-12-20 DIAGNOSIS — R079 Chest pain, unspecified: Secondary | ICD-10-CM | POA: Diagnosis present

## 2018-12-20 DIAGNOSIS — Z79899 Other long term (current) drug therapy: Secondary | ICD-10-CM | POA: Insufficient documentation

## 2018-12-20 DIAGNOSIS — R0789 Other chest pain: Secondary | ICD-10-CM | POA: Diagnosis not present

## 2018-12-20 DIAGNOSIS — E039 Hypothyroidism, unspecified: Secondary | ICD-10-CM | POA: Diagnosis not present

## 2018-12-20 DIAGNOSIS — Z79890 Hormone replacement therapy (postmenopausal): Secondary | ICD-10-CM | POA: Diagnosis not present

## 2018-12-20 LAB — CBC
HCT: 42.3 % (ref 36.0–46.0)
Hemoglobin: 14.4 g/dL (ref 12.0–15.0)
MCH: 31.9 pg (ref 26.0–34.0)
MCHC: 34 g/dL (ref 30.0–36.0)
MCV: 93.8 fL (ref 80.0–100.0)
Platelets: 192 10*3/uL (ref 150–400)
RBC: 4.51 MIL/uL (ref 3.87–5.11)
RDW: 12.9 % (ref 11.5–15.5)
WBC: 4.4 10*3/uL (ref 4.0–10.5)
nRBC: 0 % (ref 0.0–0.2)

## 2018-12-20 LAB — BASIC METABOLIC PANEL
Anion gap: 13 (ref 5–15)
BUN: 22 mg/dL (ref 8–23)
CO2: 23 mmol/L (ref 22–32)
Calcium: 9.4 mg/dL (ref 8.9–10.3)
Chloride: 103 mmol/L (ref 98–111)
Creatinine, Ser: 0.63 mg/dL (ref 0.44–1.00)
GFR calc Af Amer: >60 mL/min (ref >60)
GFR calc non Af Amer: >60 mL/min (ref >60)
Glucose, Bld: 109 mg/dL — ABNORMAL HIGH (ref 70–99)
Potassium: 4.3 mmol/L (ref 3.5–5.1)
Sodium: 139 mmol/L (ref 135–145)

## 2018-12-20 LAB — TSH: TSH: 0.889 u[IU]/mL (ref 0.350–4.500)

## 2018-12-20 LAB — SARS CORONAVIRUS 2 (TAT 6-24 HRS): SARS Coronavirus 2: NEGATIVE

## 2018-12-20 LAB — MAGNESIUM: Magnesium: 1.9 mg/dL (ref 1.7–2.4)

## 2018-12-20 LAB — TROPONIN I (HIGH SENSITIVITY)
Troponin I (High Sensitivity): 61 ng/L — ABNORMAL HIGH (ref <18)
Troponin I (High Sensitivity): 58 ng/L — ABNORMAL HIGH (ref <18)

## 2018-12-20 LAB — T4, FREE: Free T4: 1.22 ng/dL — ABNORMAL HIGH (ref 0.61–1.12)

## 2018-12-20 MED ORDER — OMEGA-3-ACID ETHYL ESTERS 1 G PO CAPS
1.0000 g | ORAL_CAPSULE | Freq: Every day | ORAL | Status: DC
  Filled 2018-12-20: qty 1

## 2018-12-20 MED ORDER — ADULT MULTIVITAMIN W/MINERALS CH: 1.0000 | ORAL_TABLET | Freq: Every day | ORAL | Status: DC

## 2018-12-20 MED ORDER — SODIUM CHLORIDE 0.9% FLUSH: 3.0000 mL | Freq: Once | INTRAVENOUS | Status: DC

## 2018-12-20 MED ORDER — ASPIRIN 81 MG PO CHEW
324.0000 mg | CHEWABLE_TABLET | Freq: Once | ORAL | Status: AC
  Administered 2018-12-20: 324 mg via ORAL
  Filled 2018-12-20: qty 4

## 2018-12-20 NOTE — H&P (Addendum)
[place] [facility] [place] NAME: Kellie Roberson    MR#:  [id number]  DATE OF BIRTH:  [birth year]  DATE OF ADMISSION:  12/20/2018  PRIMARY CARE PHYSICIAN: [name], MD   REQUESTING/REFERRING PHYSICIAN:   CHIEF COMPLAINT:   Chief Complaint  Patient presents with  . Palpitations    HISTORY OF PRESENT ILLNESS: Kellie Roberson  is a 73 y.o. female with a known history of hypothyroidism, basal cell cancer of skin presented to the emergency room for palpitations.  Patient notices palpitations last evening and her heart was racing and she felt uneasy.  Had mild chest discomfort yesterday night but no chest pain this morning.  No prior recent cardiac stress test.  No complaints of fever chills cough myalgias.  No recent travel.  No sick contacts at home.  First set of troponin is 61 which is high-sensitivity.  Hospitalist service was consulted for further care.  PAST MEDICAL HISTORY:   Past Medical History:  Diagnosis Date  . Cancer ([place]) 09/22/2018   skin ca-basal cell  . Thyroid disease     PAST SURGICAL HISTORY:  Past Surgical History:  Procedure Laterality Date  . ABDOMINAL HYSTERECTOMY  2003   total-had some structural damage after 2nd pregnancy  . THYROIDECTOMY  1975   was hyperthyroid    SOCIAL HISTORY:  Social History   Tobacco Use  . Smoking status: Never Smoker  . Smokeless tobacco: Never Used  Substance Use Topics  . Alcohol use: Yes    Comment: social    FAMILY HISTORY:  Family History  Problem Relation Age of Onset  . Alzheimer's disease Mother   . Hypertension Father   . Glaucoma Father   . Thyroid disease Sister   . Mitral valve prolapse Sister   . Breast cancer Neg Hx     DRUG ALLERGIES: No Known Allergies  REVIEW OF SYSTEMS:   CONSTITUTIONAL: No fever, fatigue or weakness.  EYES: No blurred or double vision.  EARS, NOSE, AND THROAT: No tinnitus or ear pain.  RESPIRATORY: No cough, shortness of breath,  wheezing or hemoptysis.  CARDIOVASCULAR: No chest pain, orthopnea, edema.  Palpitations GASTROINTESTINAL: No nausea, vomiting, diarrhea or abdominal pain.  GENITOURINARY: No dysuria, hematuria.  ENDOCRINE: No polyuria, nocturia,  HEMATOLOGY: No anemia, easy bruising or bleeding SKIN: No rash or lesion. MUSCULOSKELETAL: No joint pain or arthritis.   NEUROLOGIC: No tingling, numbness, weakness.  PSYCHIATRY: No anxiety or depression.   MEDICATIONS AT HOME:  Prior to Admission medications   Medication Sig Start Date End Date Taking? Authorizing Provider  Ascorbic Acid (VITAMIN C) 100 MG tablet Take 100 mg by mouth daily as needed.    [provider]  bacitracin-polymyxin b (POLYSPORIN) ophthalmic ointment Place 1 application into the left eye 4 (four) times daily. 11/17/18   [name], MD  cefdinir (OMNICEF) 300 MG capsule Take 1 capsule (300 mg total) by mouth 2 (two) times daily. 11/19/18   [name], MD  cephALEXin (KEFLEX) 500 MG capsule Take 500 mg by mouth 2 (two) times daily.    [provider]  CLIMARA 0.025 MG/24HR patch NAME BRAND ONLY.  PATIENT WILL PAY OUT OF POCKET 10/31/18   [name], MD  meloxicam (MOBIC) 15 MG tablet Take 1 tablet (15 mg total) by mouth daily. 08/14/18   [name], MD  neomycin-polymyxin-dexamethasone (MAXITROL) 0.1 % ophthalmic suspension Apply 2 drops to eye 4 (four) times daily.  [provider]  Probiotic Product (PROBIOTIC-10 PO) Take by mouth daily.    [provider]  SYNTHROID 100 MCG tablet TAKE 1 TABLET (100 MCG TOTAL) BY MOUTH DAILY. NAME BRAND ONLY 07/25/18   [name], MD      PHYSICAL EXAMINATION:   VITAL SIGNS: Blood pressure (!) 148/90, pulse 80, temperature 97.6 F (36.4 C), temperature source Oral, resp. rate 18, height 5\' 6"  (1.676 m), weight 52.2 kg, SpO2 97 %.  GENERAL:  73 y.o.-year-old patient lying in the bed with no acute distress.  EYES: Pupils equal, round, reactive  to light and accommodation. No scleral icterus. Extraocular muscles intact.  HEENT: Head atraumatic, normocephalic. Oropharynx and nasopharynx clear.  NECK:  Supple, no jugular venous distention. No thyroid enlargement, no tenderness.  LUNGS: Normal breath sounds bilaterally, no wheezing, rales,rhonchi or crepitation. No use of accessory muscles of respiration.  CARDIOVASCULAR: S1, S2 normal. No murmurs, rubs, or gallops.  ABDOMEN: Soft, nontender, nondistended. Bowel sounds present. No organomegaly or mass.  EXTREMITIES: No pedal edema, cyanosis, or clubbing.  NEUROLOGIC: Cranial nerves II through XII are intact. Muscle strength 5/5 in all extremities. Sensation intact. Gait not checked.  PSYCHIATRIC: The patient is alert and oriented x 3.  SKIN: No obvious rash, lesion, or ulcer.   LABORATORY PANEL:   CBC Recent Labs  Lab 12/20/18 0939  WBC 4.4  HGB 14.4  HCT 42.3  PLT 192  MCV 93.8  MCH 31.9  MCHC 34.0  RDW 12.9   ------------------------------------------------------------------------------------------------------------------  Chemistries  Recent Labs  Lab 12/20/18 0939  NA 139  K 4.3  CL 103  CO2 23  GLUCOSE 109*  BUN 22  CREATININE 0.63  CALCIUM 9.4   ------------------------------------------------------------------------------------------------------------------ estimated creatinine clearance is 53.2 mL/min (by C-G formula based on SCr of 0.63 mg/dL). ------------------------------------------------------------------------------------------------------------------ No results for input(s): TSH, T4TOTAL, T3FREE, THYROIDAB in the last 72 hours.  Invalid input(s): FREET3   Coagulation profile No results for input(s): INR, PROTIME in the last 168 hours. ------------------------------------------------------------------------------------------------------------------- No results for input(s): DDIMER in the last 72  hours. -------------------------------------------------------------------------------------------------------------------  Cardiac Enzymes No results for input(s): CKMB, TROPONINI, MYOGLOBIN in the last 168 hours.  Invalid input(s): CK ------------------------------------------------------------------------------------------------------------------ Invalid input(s): POCBNP  ---------------------------------------------------------------------------------------------------------------  Urinalysis No results found for: COLORURINE, APPEARANCEUR, LABSPEC, PHURINE, GLUCOSEU, HGBUR, BILIRUBINUR, KETONESUR, PROTEINUR, UROBILINOGEN, NITRITE, LEUKOCYTESUR   RADIOLOGY: Dg Chest 2 View  Result Date: 12/20/2018 CLINICAL DATA:  Palpitations. Tachycardia. EXAM: CHEST - 2 VIEW COMPARISON:  None. FINDINGS: Heart size is normal. Ordinary age related aortic atherosclerosis. The pulmonary vascularity is normal. The lungs are clear except for a probable calcified granuloma in the right lung. No effusions. No acute bone finding. Previous thyroidectomy clips. IMPRESSION: No active disease. Ordinary aortic atherosclerosis. Probable calcified granuloma in the right lung. Electronically Signed   By: Nelson Chimes M.D.   On: 12/20/2018 10:22    EKG: Orders placed or performed during the hospital encounter of 12/20/18  . EKG 12-Lead  . EKG 12-Lead  . ED EKG  . ED EKG    IMPRESSION AND PLAN: 73 year old female patient with a known history of hypothyroidism, basal cell cancer of skin presented to the emergency room for palpitations.  Patient noticed palpitations last evening and her heart was racing and she felt uneasy.    -Atypical chest discomfort, palpitations Admit patient to telemetry observation bed Cycle troponin to rule out myocardial ischemia Check echocardiogram to assess LV function Cardiology consult  -Hypothyroidism Resume Synthroid  -DVT prophylaxis with subcu Lovenox  daily  All the  records are reviewed and case discussed with ED provider. Management plans discussed with the patient, family and they are in agreement.  CODE STATUS: Full code  TOTAL TIME TAKING CARE OF THIS PATIENT: 51 minutes.    Saundra Shelling M.D on 12/20/2018 at 11:15 AM  Between 7am to 6pm - Pager - [phone]  After 6pm go to [email or web address] - password [email or web address] [facility]  Office  [phone]  CC: Primary care physician; [name], MD

## 2018-12-20 NOTE — Care Management (Signed)
RNCM met with patient to deliver [place] letter. She hopes to have ECHO performed from ED and go home. Her car in in ED parking lot as she drove herself here. MOON delivered in case she stays. She is independent from home and has PCP. She denies RNCM needs at this time.

## 2018-12-20 NOTE — Progress Notes (Signed)
Advanced care plan. Purpose of the Encounter: CODE STATUS Parties in Attendance: Patient Patient's Decision Capacity: Good Subjective/Patient's story: 73 y.o. female with a known history of hypothyroidism, basal cell cancer of skin presented to the emergency room for palpitations.  Patient notices palpitations last evening and her heart was racing and she felt uneasy.  Had mild chest discomfort yesterday night but no chest pain this morning.  No prior recent cardiac stress test.  No complaints of fever chills cough myalgias.  No recent travel.  No sick contacts at home.  First set of troponin is 61 which is high-sensitivity Objective/Medical story Needs cardiology evaluation, serial troponins Stress test to work-up Goals of care determination:  Advance care directives goals of care treatment plan discussed She is full resuscitation for now CODE STATUS: Full code Time spent discussing advanced care planning: 16 minutes

## 2018-12-20 NOTE — ED Notes (Signed)
Dr. Estanislado Pandy called and confirmed that patient could go to Echo and then be discharged.

## 2018-12-20 NOTE — ED Triage Notes (Signed)
Sensation of rapid irregular heartrate for approx 1 hour during night. States was not painful. [place] set of blood drawn and sent in triage.

## 2018-12-20 NOTE — ED Provider Notes (Signed)
[facility] Emergency Department Provider Note  ____________________________________________   First MD Initiated Contact with Patient 12/20/18 1003     (approximate)  I have reviewed the triage vital signs and the nursing notes.  History  Chief Complaint Palpitations    HPI Kellie Roberson is a 73 y.o. female with a history of hypothyroidism (secondary to thyroidectomy), otherwise healthy who presents to the emergency department for an episode of palpitations/chest discomfort.  Patient states as she was going to bed last night she experienced about an hour episode, where she felt like her heart was racing, and felt like her heart was pounding strongly.  She felt like she was having skipped beats as well.  She had some mild associated diaphoresis, but no shortness of breath or nausea.  She denies any personal history of arrhythmias or heart disease, but does have a family history of CAD as well as atrial fibrillation.  She does not smoke, and is otherwise healthy and exercises regularly.   Past Medical Hx Past Medical History:  Diagnosis Date  . Cancer ([place]) 09/22/2018   skin ca-basal cell  . Thyroid disease     Problem List Patient Active Problem List   Diagnosis Date Noted  . Hordeolum externum (stye) 11/17/2018  . Osteoporosis 07/28/2018  . Breast cancer screening 07/28/2018  . Colon cancer screening 07/28/2018  . Elevated blood pressure reading in office without diagnosis of hypertension 04/23/2016  . S/P laparoscopic supracervical hysterectomy 10/23/2015  . S/P BSO (bilateral salpingo-oophorectomy) 10/23/2015  . Post-menopause on HRT (hormone replacement therapy) 10/23/2015  . Postoperative hypothyroidism 10/23/2015    Past Surgical Hx Past Surgical History:  Procedure Laterality Date  . ABDOMINAL HYSTERECTOMY  2003   total-had some structural damage after 2nd pregnancy  . THYROIDECTOMY  1975   was hyperthyroid    Medications Prior to  Admission medications   Medication Sig Start Date End Date Taking? Authorizing Provider  Ascorbic Acid (VITAMIN C) 100 MG tablet Take 100 mg by mouth daily as needed.    [provider]  bacitracin-polymyxin b (POLYSPORIN) ophthalmic ointment Place 1 application into the left eye 4 (four) times daily. 11/17/18   [name], MD  cefdinir (OMNICEF) 300 MG capsule Take 1 capsule (300 mg total) by mouth 2 (two) times daily. 11/19/18   [name], MD  cephALEXin (KEFLEX) 500 MG capsule Take 500 mg by mouth 2 (two) times daily.    [provider]  CLIMARA 0.025 MG/24HR patch NAME BRAND ONLY.  PATIENT WILL PAY OUT OF POCKET 10/31/18   [name], MD  meloxicam (MOBIC) 15 MG tablet Take 1 tablet (15 mg total) by mouth daily. 08/14/18   [name], MD  neomycin-polymyxin-dexamethasone (MAXITROL) 0.1 % ophthalmic suspension Apply 2 drops to eye 4 (four) times daily.    [provider]  Probiotic Product (PROBIOTIC-10 PO) Take by mouth daily.    [provider]  SYNTHROID 100 MCG tablet TAKE 1 TABLET (100 MCG TOTAL) BY MOUTH DAILY. NAME BRAND ONLY 07/25/18   [name], MD    Allergies Patient has no known allergies.  Family Hx Family History  Problem Relation Age of Onset  . Alzheimer's disease Mother   . Hypertension Father   . Glaucoma Father   . Thyroid disease Sister   . Mitral valve prolapse Sister   . Breast cancer Neg Hx     Social Hx Social History   Tobacco Use  . Smoking status:  Never Smoker  . Smokeless tobacco: Never Used  Substance Use Topics  . Alcohol use: Yes    Comment: social  . Drug use: No     Review of Systems  Constitutional: Negative for fever, chills. Eyes: Negative for visual changes. ENT: Negative for sore throat. Cardiovascular: + palpitations Respiratory: Negative for shortness of breath. Gastrointestinal: Negative for nausea, vomiting.  Genitourinary: Negative for dysuria. Musculoskeletal:  Negative for leg swelling. Skin: Negative for rash. Neurological: Negative for for headaches.   Physical Exam  Vital Signs: ED Triage Vitals  Enc Vitals Group     BP 12/20/18 0933 (!) 148/90     Pulse Rate 12/20/18 0933 80     Resp 12/20/18 0933 18     Temp 12/20/18 0933 97.6 F (36.4 C)     Temp Source 12/20/18 0933 Oral     SpO2 12/20/18 0933 97 %     Weight 12/20/18 0934 115 lb (52.2 kg)     Height 12/20/18 0934 5\' 6"  (1.676 m)     Head Circumference --      Peak Flow --      Pain Score 12/20/18 0936 0     Pain Loc --      Pain Edu? --      Excl. in [place]? --     Constitutional: Alert and oriented.  Head: Normocephalic. Atraumatic. Eyes: Conjunctivae clear. Sclera anicteric. Nose: No congestion. No rhinorrhea. Mouth/Throat: Mucous membranes are moist.  Neck: No stridor.   Cardiovascular: Normal rate, regular rhythm. Extremities well perfused. Respiratory: Normal respiratory effort.  Lungs CTAB. Gastrointestinal: Soft. Non-tender. Non-distended.  Musculoskeletal: No lower extremity edema. No deformities. Neurologic:  Normal speech and language. No gross focal neurologic deficits are appreciated.  Skin: Skin is warm, dry and intact. No rash noted. Psychiatric: Mood and affect are appropriate for situation.  EKG  Personally reviewed.   Rate: 83 Rhythm: sinus Axis: LAD Intervals: WNL No acute ischemic changes PVCs No STEMI    Radiology  XR: IMPRESSION:  No active disease. Ordinary aortic atherosclerosis. Probable  calcified granuloma in the right lung.    Procedures  Procedure(s) performed (including critical care):  Procedures   Initial Impression / Assessment and Plan / ED Course  73 y.o. female who presents to the ED for an episode of palpitations last night.  Ddx: arrhythmia, ACS, thyroid disease, electrolyte abnormality  Plan: labs, EKG, ASA  Electrolytes without actionable derangements.  EKG w/o acute ischemic changes, PVC noted, which  could be etiology of her palpitations, but also consider an episode of AF or SVT. HS troponin 61. Given her troponin, will plan to admit for observation/telemetry for further monitoring and evaluation. She is agreeable with the plan.   Final Clinical Impression(s) / ED Diagnosis  Final diagnoses:  Palpitations       Note:  This document was prepared using Dragon voice recognition software and may include unintentional dictation errors.   Lilia Pro., MD 12/20/18 1230

## 2018-12-20 NOTE — Discharge Summary (Addendum)
[facility] [place] NAME: Kellie Roberson    MR#:  [id number]  DATE OF BIRTH:  1945/04/17  DATE OF ADMISSION:  12/20/2018 ADMITTING PHYSICIAN: Saundra Shelling MD  DATE OF DISCHARGE: 12/10/2018  PRIMARY CARE PHYSICIAN: [name], MD   ADMISSION DIAGNOSIS:  rapid heartbeat  DISCHARGE DIAGNOSIS:  Atypical chest pain Palpitations Hypothyroidism  SECONDARY DIAGNOSIS:   Past Medical History:  Diagnosis Date  . Cancer ([place]) 09/22/2018   skin ca-basal cell  . Thyroid disease      ADMITTING HISTORY Kellie Roberson  is a 73 y.o. female with a known history of hypothyroidism, basal cell cancer of skin presented to the emergency room for palpitations.  Patient notices palpitations last evening and her heart was racing and she felt uneasy.  Had mild chest discomfort yesterday night but no chest pain this morning.  No prior recent cardiac stress test.  No complaints of fever chills cough myalgias.  No recent travel.  No sick contacts at home.  First set of troponin is 61 which is high-sensitivity.  Hospitalist service was consulted for further care.  HOSPITAL COURSE:  Patient was evaluated and serial troponins were checked.  Cardiology evaluation was done.  Patient has low risk factors and cardiology advised ischemic work-up as outpatient.  Patient does not have any chest pain does not experience any heart racing.  Patient will be discharged home follow-up with cardiology clinic.  Disposition plan discussed with RN taking care of the patient, patient aware of the plan.  CONSULTS OBTAINED:  Treatment Team:  Buford Dresser, MD  DRUG ALLERGIES:  No Known Allergies  DISCHARGE MEDICATIONS:   Allergies as of 12/20/2018   No Known Allergies     Medication List    TAKE these medications   Climara 0.025 mg/24hr patch Generic drug: estradiol NAME BRAND ONLY.  PATIENT WILL PAY OUT OF POCKET   Lovaza 1 g capsule Generic drug: omega-3 acid  ethyl esters Take 1 g by mouth daily.   meloxicam 15 MG tablet Commonly known as: MOBIC Take 1 tablet (15 mg total) by mouth daily. What changed:   when to take this  reasons to take this   multivitamin with minerals Tabs tablet Take 1 tablet by mouth daily.   PROBIOTIC-10 PO Take by mouth daily.   Synthroid 100 MCG tablet Generic drug: levothyroxine TAKE 1 TABLET (100 MCG TOTAL) BY MOUTH DAILY. NAME BRAND ONLY   vitamin C 100 MG tablet Take 100 mg by mouth daily as needed.       Today  Patient seen today No chest pain No palpitations Hemodynamically stable VITAL SIGNS:  Blood pressure 116/83, pulse 67, temperature 97.6 F (36.4 C), temperature source Oral, resp. rate 16, height 5\' 6"  (1.676 m), weight 52.2 kg, SpO2 97 %.  I/O:  No intake or output data in the 24 hours ending 12/20/18 1549  PHYSICAL EXAMINATION:  Physical Exam  GENERAL:  73 y.o.-year-old patient lying in the bed with no acute distress.  LUNGS: Normal breath sounds bilaterally, no wheezing, rales,rhonchi or crepitation. No use of accessory muscles of respiration.  CARDIOVASCULAR: S1, S2 normal. No murmurs, rubs, or gallops.  ABDOMEN: Soft, non-tender, non-distended. Bowel sounds present. No organomegaly or mass.  NEUROLOGIC: Moves all 4 extremities. PSYCHIATRIC: The patient is alert and oriented x 3.  SKIN: No obvious rash, lesion, or ulcer.   DATA REVIEW:   CBC Recent Labs  Lab 12/20/18 0939  WBC 4.4  HGB 14.4  HCT 42.3  PLT 192    Chemistries  Recent Labs  Lab 12/20/18 0939  NA 139  K 4.3  CL 103  CO2 23  GLUCOSE 109*  BUN 22  CREATININE 0.63  CALCIUM 9.4  MG 1.9    Cardiac Enzymes No results for input(s): TROPONINI in the last 168 hours.  Microbiology Results  No results found for this or any previous visit.  RADIOLOGY:  Dg Chest 2 View  Result Date: 12/20/2018 CLINICAL DATA:  Palpitations. Tachycardia. EXAM: CHEST - 2 VIEW COMPARISON:  None. FINDINGS: Heart  size is normal. Ordinary age related aortic atherosclerosis. The pulmonary vascularity is normal. The lungs are clear except for a probable calcified granuloma in the right lung. No effusions. No acute bone finding. Previous thyroidectomy clips. IMPRESSION: No active disease. Ordinary aortic atherosclerosis. Probable calcified granuloma in the right lung. Electronically Signed   By: Nelson Chimes M.D.   On: 12/20/2018 10:22    Follow up with PCP in 1 week.  Management plans discussed with the patient, family and they are in agreement.  CODE STATUS: Full code  TOTAL TIME TAKING CARE OF THIS PATIENT ON DAY OF DISCHARGE: more than 34 minutes.   Saundra Shelling M.D on 12/20/2018 at 3:49 PM  Between 7am to 6pm - Pager - [phone]  After 6pm go to [email or web address] - password [email or web address] [place] [facility]  Office  [phone]  CC: Primary care physician; [name], MD  Note: This dictation was prepared with Dragon dictation along with smaller phrase technology. Any transcriptional errors that result from this process are unintentional.

## 2018-12-20 NOTE — Care Management Obs Status (Signed)
[place] NOTIFICATION   Patient Details  Name: DEETHYA HORKEY MRN: [id number] Date of Birth: 10/06/1945   Medicare Observation Status Notification Given:  Yes    Marshell Garfinkel, RN 12/20/2018, 2:12 PM

## 2018-12-20 NOTE — Consult Note (Signed)
Cardiology Consultation:   Patient ID: Kellie Roberson MRN: [id number]; DOB: [birth year]  Admit date: 12/20/2018 Date of Consult: 12/20/2018  Primary Care Provider: [name], MD Primary Cardiologist: [facility]--[facility]   Patient Profile:   Kellie Roberson is a 73 y.o. female with a hx of hypothyroidism who is being seen today for the evaluation of palpitations and elevated troponin at the request of Dr. Estanislado Pandy.  History of Present Illness:   Kellie Roberson has no known prior cardiac history. Last evening she had an episode of palpitations that last about an hour, denies any chest pain.  Tachycardia/palpitations: -Initial onset: for years has had brief movements of fast heart beats, took 1 deep breath and then would stop. Last night's event was first time she could not make this stop. Was 10 times worse than usual. Very irregular, hard beats. Denies all chest pain multiple times. -Frequency/Duration: once last evening, lasted from about 11 pm-1 AM. -Associated symptoms: heart pounding/racing, skipped beats. Denies shortness of breath, no nausea, no syncope. Denies diaphoresis to me. -Aggravating/alleviating factors: none -Prior cardiac history: none -Prior ECG: none available to me -Prior workup: >5 years ago Dr. Josefa Half had eval; had monitor, echo, treadmill. Monitor with PVCs/PACs with rare atrial runs. ETT with 6 min Bruce, negative. Echo normal LVEF, mild MR. -Prior treatment: none; she self treated with 650 mg aspirin yesterday. -Possible medication interactions: takes synthroid chronically -Caffeine: 1 lg cup coffee daily -Alcohol: 2 glasses (small) daily -Tobacco: never -OTC supplements: all listed, reviewed -Comorbidities: none -Exercise level: very active at baseline, hiked [employer] for a 3 mile trail with elevation changes. Activity daily, lives on [place], works outside all the time. -Labs: TSH, kidney function/electrolytes, CBC reviewed. -Cardiac ROS: no chest  pain, no shortness of breath, no PND, no orthopnea, no LE edema. -Family history:  Identical twin with heart disease. Unclear details, will try to find out. 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 Concerned if it is afib or other arrhythmia. Her husband sees Dr. Fletcher Anon, has been stented. She would like to be seen in [place] so she and her husband are in the same practice.  Heart Pathway Score:     Past Medical History:  Diagnosis Date  . Cancer ([place]) 09/22/2018   skin ca-basal cell  . Thyroid disease     Past Surgical History:  Procedure Laterality Date  . ABDOMINAL HYSTERECTOMY  2003   total-had some structural damage after 2nd pregnancy  . THYROIDECTOMY  1975   was hyperthyroid     Home Medications:  Prior to Admission medications   Medication Sig Start Date End Date Taking? Authorizing Provider  Ascorbic Acid (VITAMIN C) 100 MG tablet Take 100 mg by mouth daily as needed.   Yes [provider]  CLIMARA 0.025 MG/24HR patch NAME BRAND ONLY.  PATIENT WILL PAY OUT OF POCKET 10/31/18  Yes [name], MD  meloxicam (MOBIC) 15 MG tablet Take 1 tablet (15 mg total) by mouth daily. Patient taking differently: Take 15 mg by mouth daily as needed.  08/14/18  Yes [name], MD  Multiple Vitamin (MULTIVITAMIN WITH MINERALS) TABS tablet Take 1 tablet by mouth daily.   Yes [provider]  omega-3 acid ethyl esters (LOVAZA) 1 g capsule Take 1 g by mouth daily.   Yes [provider]  Probiotic Product (PROBIOTIC-10 PO) Take by mouth daily.   Yes [provider]  SYNTHROID 100 MCG  tablet TAKE 1 TABLET (100 MCG TOTAL) BY MOUTH DAILY. NAME BRAND ONLY 07/25/18  Yes [name], MD    Inpatient Medications: Scheduled Meds: . sodium chloride flush  3 mL Intravenous Once   Continuous Infusions:  PRN Meds:   Allergies:   No Known Allergies  Social History:   Social History   Socioeconomic History   . Marital status: Married    Spouse name: Not on file  . Number of children: Not on file  . Years of education: Not on file  . Highest education level: Not on file  Occupational History  . Not on file  Social Needs  . Financial resource strain: Not hard at all  . Food insecurity    Worry: Never true    Inability: Never true  . Transportation needs    Medical: No    Non-medical: No  Tobacco Use  . Smoking status: Never Smoker  . Smokeless tobacco: Never Used  Substance and Sexual Activity  . Alcohol use: Yes    Comment: social  . Drug use: No  . Sexual activity: Not on file  Lifestyle  . Physical activity    Days per week: 5 days    Minutes per session: 60 min  . Stress: Not at all  Relationships  . Social Herbalist on phone: Not on file    Gets together: Not on file    Attends religious service: Not on file    Active member of club or organization: Not on file    Attends meetings of clubs or organizations: Not on file    Relationship status: Not on file  . Intimate partner violence    Fear of current or ex partner: No    Emotionally abused: No    Physically abused: No    Forced sexual activity: No  Other Topics Concern  . Not on file  Social History Narrative  . Not on file    Family History:    Family History  Problem Relation Age of Onset  . Alzheimer's disease Mother   . Hypertension Father   . Glaucoma Father   . Thyroid disease Sister   . Mitral valve prolapse Sister   . Breast cancer Neg Hx      ROS:  Please see the history of present illness.  Constitutional: Negative for chills, fever, night sweats, unintentional weight loss  HENT: Negative for ear pain and hearing loss.   Eyes: Negative for loss of vision and eye pain.  Respiratory: Negative for cough, sputum, wheezing.   Cardiovascular: See HPI. Gastrointestinal: Negative for abdominal pain, melena, and hematochezia.  Genitourinary: Negative for dysuria and hematuria.   Musculoskeletal: Negative for falls and myalgias.  Skin: Negative for itching and rash.  Neurological: Negative for focal weakness, focal sensory changes and loss of consciousness.  Endo/Heme/Allergies: Does not bruise/bleed easily.  All other ROS reviewed and negative.     Physical Exam/Data:   Vitals:   12/20/18 0933 12/20/18 0934 12/20/18 1100 12/20/18 1130  BP: (!) 148/90  (!) 156/87 (!) 162/100  Pulse: 80  67 60  Resp: 18  15 14   Temp: 97.6 F (36.4 C)     TempSrc: Oral     SpO2: 97%  96% 96%  Weight:  52.2 kg    Height:  5\' 6"  (1.676 m)     No intake or output data in the 24 hours ending 12/20/18 1226 Last 3 Weights 12/20/2018 11/17/2018 04/23/2016  Weight (lbs)  115 lb 152 lb 152 lb  Weight (kg) 52.164 kg 68.947 kg 68.947 kg     Body mass index is 18.56 kg/m.  General:  Well nourished, well developed, in no acute distress HEENT: normal Lymph: no adenopathy Neck: no JVD Endocrine:  No thryomegaly Vascular: No carotid bruits; RA pulses 2+ bilaterally Cardiac:  normal S1, S2; RRR; no murmur  Lungs:  clear to auscultation bilaterally, no wheezing, rhonchi or rales  Abd: soft, nontender, no hepatomegaly  Ext: no edema Musculoskeletal:  No deformities, BUE and BLE strength normal and equal Skin: warm and dry  Neuro:  CNs 2-12 intact, no focal abnormalities noted Psych:  Normal affect   EKG:  The EKG was personally reviewed and demonstrates:  NSR with PVC, late R wave progression. No prior. Telemetry:  Telemetry was personally reviewed and demonstrates:  NSR with occasional PVCs  Relevant CV Studies: No prior images, but reviewed prior reports (as above) from 2015 [facility] workup  Laboratory Data:  High Sensitivity Troponin:   Recent Labs  Lab 12/20/18 0939  TROPONINIHS 61*     Chemistry Recent Labs  Lab 12/20/18 0939  NA 139  K 4.3  CL 103  CO2 23  GLUCOSE 109*  BUN 22  CREATININE 0.63  CALCIUM 9.4  GFRNONAA >60  GFRAA >60  ANIONGAP 13    No  results for input(s): PROT, ALBUMIN, AST, ALT, ALKPHOS, BILITOT in the last 168 hours. Hematology Recent Labs  Lab 12/20/18 0939  WBC 4.4  RBC 4.51  HGB 14.4  HCT 42.3  MCV 93.8  MCH 31.9  MCHC 34.0  RDW 12.9  PLT 192   BNPNo results for input(s): BNP, PROBNP in the last 168 hours.  DDimer No results for input(s): DDIMER in the last 168 hours.   Radiology/Studies:  Dg Chest 2 View  Result Date: 12/20/2018 CLINICAL DATA:  Palpitations. Tachycardia. EXAM: CHEST - 2 VIEW COMPARISON:  None. FINDINGS: Heart size is normal. Ordinary age related aortic atherosclerosis. The pulmonary vascularity is normal. The lungs are clear except for a probable calcified granuloma in the right lung. No effusions. No acute bone finding. Previous thyroidectomy clips. IMPRESSION: No active disease. Ordinary aortic atherosclerosis. Probable calcified granuloma in the right lung. Electronically Signed   By: Nelson Chimes M.D.   On: 12/20/2018 10:22    Assessment and Plan:   Palpitations/hard beats, elevated troponins -troponins 61 ->58, not consistent with ACS. May be due to arrhythmia from yesterday. Currently asymptomatic. No ischemic evaluation needed as an inpatient -has had documentation of PVCs/PACs/atrial arrhythmias in the past. High suspicion this was arrhythmic event, either frequent PVCs, atrial arrhythmia such as afib -she is now asymptomatic and hemodynamically stable. -counseled on red flag warning signs that need immediate medical attention -her husband sees Dr. Fletcher Anon, and she would like to be seen in [facility] group so they can be in the same office. We will arrange.  From my perspective, she could be further worked up as an outpatient, potentially with echo, monitor, and possible coronary CTA.  [facility] will sign off.    For questions or updates, please contact [place] Please consult [email or web address] for contact info under   Signed, Buford Dresser, MD   12/20/2018 12:26 PM

## 2018-12-22 ENCOUNTER — Other Ambulatory Visit: Payer: Self-pay

## 2018-12-22 ENCOUNTER — Ambulatory Visit (INDEPENDENT_AMBULATORY_CARE_PROVIDER_SITE_OTHER): Payer: Medicare HMO

## 2018-12-22 ENCOUNTER — Ambulatory Visit (INDEPENDENT_AMBULATORY_CARE_PROVIDER_SITE_OTHER): Payer: Medicare HMO | Admitting: Cardiology

## 2018-12-22 ENCOUNTER — Telehealth: Payer: Self-pay

## 2018-12-22 ENCOUNTER — Encounter: Payer: Self-pay | Admitting: Cardiology

## 2018-12-22 VITALS — BP 128/88 | HR 79 | Ht 66.0 in | Wt 153.0 lb

## 2018-12-22 DIAGNOSIS — R778 Other specified abnormalities of plasma proteins: Secondary | ICD-10-CM

## 2018-12-22 DIAGNOSIS — R002 Palpitations: Secondary | ICD-10-CM | POA: Diagnosis not present

## 2018-12-22 DIAGNOSIS — R7989 Other specified abnormal findings of blood chemistry: Secondary | ICD-10-CM

## 2018-12-22 MED ORDER — METOPROLOL TARTRATE 100 MG PO TABS: 100.0000 mg | ORAL_TABLET | Freq: Once | ORAL | 0 refills | Status: DC

## 2018-12-22 NOTE — Telephone Encounter (Signed)
Unable to reach patient for transitional care management. Recent ED visit with observation.  Left message (HIPAA complaint) to call me back regarding hospital follow up. Mychart reads patient plans to follow up with cardiology prior to pcp.  Will continue to follow as appropriate.

## 2018-12-22 NOTE — Progress Notes (Signed)
Cardiology Office Note:    Date:  12/22/2018   ID:  Kellie Roberson, DOB [birth year], MRN [id number]  PCP:  [name], MD  Cardiologist:  Kate Sable, MD  Electrophysiologist:  None   Referring MD: [name], MD   Chief Complaint  Patient presents with  . New Patient (Initial Visit)    Paitent c.o chest pain and palpitations. Meds reviewed verbally with patient.     History of Present Illness:    Kellie Roberson is a 73 y.o. female with a hx of hypothyroidism who presents due to palpitations.  Patient had symptoms of palpitations about 3 days ago when she was about to go to sleep.  She states symptoms of her chest beating fast lasted about an hour.  She denies any dizziness, chest pain, shortness of breath with symptoms.  She she has experience her heart skipping a beat of brief episodes of her heart beating fast on and off for at least 5 years now, but never last this long.  She typically takes deep breaths to help with symptoms of palpitations.  This time, she tried taking deep breaths but symptoms persisted.  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  Eventually her symptoms slowly improved and she was able to go to sleep.  She woke up next morning and was worried about the symptoms and decided to call an urgent care for an appointment.  Patient describes symptoms and was recommended to go to the emergency room.  patient was seen in the hospital as a result of palpitations and noted to have elevated high-sensitivity troponins with level of 61.  She was asymptomatic, ECG did not show any findings for ACS.  Patient was then discharged home to follow-up with cardiology.  She drinks 1 cup of coffee a day.  She denies drinking energy drinks.  She does not smoke.  Past Medical History:  Diagnosis Date  . Cancer ([place]) 09/22/2018   skin ca-basal cell  . Thyroid disease     Past Surgical History:  Procedure Laterality Date  . ABDOMINAL HYSTERECTOMY   2003   total-had some structural damage after 2nd pregnancy  . THYROIDECTOMY  1975   was hyperthyroid    Current Medications: Current Meds  Medication Sig  . Ascorbic Acid (VITAMIN C) 100 MG tablet Take 100 mg by mouth daily as needed.  Marland Kitchen CLIMARA 0.025 MG/24HR patch NAME BRAND ONLY.  PATIENT WILL PAY OUT OF POCKET  . meloxicam (MOBIC) 15 MG tablet Take 1 tablet (15 mg total) by mouth daily. (Patient taking differently: Take 15 mg by mouth daily as needed. )  . Multiple Vitamin (MULTIVITAMIN WITH MINERALS) TABS tablet Take 1 tablet by mouth daily.  Marland Kitchen omega-3 acid ethyl esters (LOVAZA) 1 g capsule Take 1 g by mouth daily.  . Probiotic Product (PROBIOTIC-10 PO) Take by mouth daily.  Marland Kitchen SYNTHROID 100 MCG tablet TAKE 1 TABLET (100 MCG TOTAL) BY MOUTH DAILY. NAME BRAND ONLY     Allergies:   Patient has no known allergies.   Social History   Socioeconomic History  . Marital status: Married    Spouse name: Not on file  . Number of children: Not on file  . Years of education: Not on file  . Highest education level: Not on file  Occupational History  . Not on file  Social Needs  . Financial resource strain: Not hard at all  . Food insecurity    Worry: Never  true    Inability: Never true  . Transportation needs    Medical: No    Non-medical: No  Tobacco Use  . Smoking status: Never Smoker  . Smokeless tobacco: Never Used  Substance and Sexual Activity  . Alcohol use: Yes    Comment: social  . Drug use: No  . Sexual activity: Not on file  Lifestyle  . Physical activity    Days per week: 5 days    Minutes per session: 60 min  . Stress: Not at all  Relationships  . Social Herbalist on phone: Not on file    Gets together: Not on file    Attends religious service: Not on file    Active member of club or organization: Not on file    Attends meetings of clubs or organizations: Not on file    Relationship status: Not on file  Other Topics Concern  . Not on file   Social History Narrative  . Not on file     Family History: The patient's family history includes Alzheimer's disease in her mother; Glaucoma in her father; Hypertension in her father; Mitral valve prolapse in her sister; Thyroid disease in her sister. There is no history of Breast cancer.  ROS:   Please see the history of present illness.     All other systems reviewed and are negative.  EKGs/Labs/Other Studies Reviewed:    The following studies were reviewed today:   EKG:  EKG is  ordered today.  The ekg ordered today demonstrates normal sinus rhythm, left atrial enlargement, possible LVH per voltage criteria.  Recent Labs: 07/22/2018: ALT 27 12/20/2018: BUN 22; Creatinine, Ser 0.63; Hemoglobin 14.4; Magnesium 1.9; Platelets 192; Potassium 4.3; Sodium 139; TSH 0.889  Recent Lipid Panel    Component Value Date/Time   CHOL 224 (H) 07/22/2018 0819   CHOL 229 (H) 07/09/2017 0732   TRIG 73.0 07/22/2018 0819   HDL 81.90 07/22/2018 0819   HDL 86 07/09/2017 0732   CHOLHDL 3 07/22/2018 0819   VLDL 14.6 07/22/2018 0819   LDLCALC 127 (H) 07/22/2018 0819   LDLCALC 128 (H) 07/09/2017 0732    Physical Exam:    VS:  BP 128/88 (BP Location: Right Arm, Patient Position: Sitting, Cuff Size: Normal)   Pulse 79   Ht 5\' 6"  (1.676 m)   Wt 153 lb (69.4 kg)   BMI 24.69 kg/m     Wt Readings from Last 3 Encounters:  12/22/18 153 lb (69.4 kg)  12/20/18 115 lb (52.2 kg)  11/17/18 152 lb (68.9 kg)     GEN:  Well nourished, well developed in no acute distress HEENT: Normal NECK: No JVD; No carotid bruits LYMPHATICS: No lymphadenopathy CARDIAC: RRR, no murmurs, rubs, gallops RESPIRATORY:  Clear to auscultation without rales, wheezing or rhonchi  ABDOMEN: Soft, non-tender, non-distended MUSCULOSKELETAL:  No edema; No deformity  SKIN: Warm and dry NEUROLOGIC:  Alert and oriented x 3 PSYCHIATRIC:  Normal affect   ASSESSMENT:   Patient symptoms are likely due to an atrial arrhythmia,  although cannot be sure.  Her elevated troponins may be due to tachycardia occurring in a patient with possible CAD. 1. Elevated troponin   2. Palpitations    PLAN:     1. We will get Zio patch x2 weeks.  Get echocardiogram.  Get coronary CTA to evaluate for possible CAD.  Total encounter time more than 45 minutes  Greater than 50% was spent in counseling and coordination of care  with the patient  This note was generated in part or whole with voice recognition software. Voice recognition is usually quite accurate but there are transcription errors that can and very often do occur. I apologize for any typographical errors that were not detected and corrected.   Medication Adjustments/Labs and Tests Ordered: Current medicines are reviewed at length with the patient today.  Concerns regarding medicines are outlined above.  Orders Placed This Encounter  Procedures  . CT CORONARY MORPH W/CTA COR W/SCORE W/CA W/CM &/OR WO/CM  . CT CORONARY FRACTIONAL FLOW RESERVE DATA PREP  . CT CORONARY FRACTIONAL FLOW RESERVE FLUID ANALYSIS  . LONG TERM MONITOR (3-14 DAYS)  . EKG 12-Lead  . ECHOCARDIOGRAM COMPLETE   Meds ordered this encounter  Medications  . metoprolol tartrate (LOPRESSOR) 100 MG tablet    Sig: Take 1 tablet (100 mg total) by mouth once for 1 dose. Take 2 hours prior to procedure.    Dispense:  1 tablet    Refill:  0    Patient Instructions  Medication Instructions:  Your physician recommends that you continue on your current medications as directed. Please refer to the Current Medication list given to you today.  *If you need a refill on your cardiac medications before your next appointment, please call your pharmacy*  Lab Work: NONE at this time unless Coronary CTA is pushed out to after 01/19/19.  If you have labs (blood work) drawn today and your tests are completely normal, you will receive your results only by: Marland Kitchen MyChart Message (if you have MyChart) OR . A paper copy  in the mail If you have any lab test that is abnormal or we need to change your treatment, we will call you to review the results.  Testing/Procedures: 1) Your physician has requested that you have an echocardiogram. Echocardiography is a painless test that uses sound waves to create images of your heart. It provides your doctor with information about the size and shape of your heart and how well your heart's chambers and valves are working. This procedure takes approximately one hour. There are no restrictions for this procedure. You may get an IV, if needed, to receive an ultrasound enhancing agent through to better visualize your heart.    2) Your physician has recommended that you wear an 14 DAY ZIO event monitor. Event monitors are medical devices that record the heart's electrical activity. Doctors most often [place] these monitors to diagnose arrhythmias. Arrhythmias are problems with the speed or rhythm of the heartbeat. The monitor is a small, portable device. You can wear one while you do your normal daily activities. This is usually used to diagnose what is causing palpitations/syncope (passing out). A Zio Patch Event Heart monitor will be applied to your chest today.  You will wear the patch for 14 days. After 24 hours, you may shower with the heart monitor on. If you feel any SYMPTOMS, you may press and release the button in the middle of the monitor.    3) Your cardiac CT will be scheduled at one of the below locations:   [facility] [address] [place], [place] [address] (336) [place] [address] [place], [place] [address] [phone]  If scheduled at [facility], please arrive at the [facility] main entrance of [facility] 30-45 minutes prior to test start time. Proceed to the [facility] Radiology Department (first floor) to check-in and test  prep.  If scheduled at  [facility], please arrive 15 mins early for check-in and test prep.  Please follow these instructions carefully (unless otherwise directed):   On the Night Before the Test: . Be sure to Drink plenty of water. . Do not consume any caffeinated/decaffeinated beverages or chocolate 12 hours prior to your test. . Do not take any antihistamines 12 hours prior to your test.   On the Day of the Test: . Drink plenty of water. Do not drink any water within one hour of the test. . Do not eat any food 4 hours prior to the test. . You may take your regular medications prior to the test.  . Take metoprolol (Lopressor) two hours prior to test. . HOLD Furosemide/Hydrochlorothiazide morning of the test. . FEMALES- please wear underwire-free bra if available       After the Test: . Drink plenty of water. . After receiving IV contrast, you may experience a mild flushed feeling. This is normal. . On occasion, you may experience a mild rash up to 24 hours after the test. This is not dangerous. If this occurs, you can take Benadryl 25 mg and increase your fluid intake. . If you experience trouble breathing, this can be serious. If it is severe call 911 IMMEDIATELY. If it is mild, please call our office. . If you take any of these medications: Glipizide/Metformin, Avandament, Glucavance, please do not take 48 hours after completing test unless otherwise instructed.    Please contact the cardiac imaging nurse navigator should you have any questions/concerns Marchia Bond, RN Navigator Cardiac Imaging [facility] [phone] Office      Follow-Up: At [facility], you and your health needs are our priority.  As part of our continuing mission to provide you with exceptional heart care, we have created designated Provider Care Teams.  These Care Teams include your primary Cardiologist (physician) and Advanced Practice Providers (APPs -  Physician  Assistants and Nurse Practitioners) who all work together to provide you with the care you need, when you need it.  Your next appointment:   2 MONTHS.  The format for your next appointment:   In Person  Provider:    You may see Kate Sable, MD or one of the following Advanced Practice Providers on your designated Care Team:    Murray Hodgkins, NP  Christell Faith, PA-C  Marrianne Mood, PA-C     Signed, Kate Sable, MD  12/22/2018 2:15 PM    [place]

## 2018-12-22 NOTE — Patient Instructions (Addendum)
Medication Instructions:  Your physician recommends that you continue on your current medications as directed. Please refer to the Current Medication list given to you today.  *If you need a refill on your cardiac medications before your next appointment, please call your pharmacy*  Lab Work: NONE at this time unless Coronary CTA is pushed out to after 01/19/19.  If you have labs (blood work) drawn today and your tests are completely normal, you will receive your results only by: Marland Kitchen MyChart Message (if you have MyChart) OR . A paper copy in the mail If you have any lab test that is abnormal or we need to change your treatment, we will call you to review the results.  Testing/Procedures: 1) Your physician has requested that you have an echocardiogram. Echocardiography is a painless test that uses sound waves to create images of your heart. It provides your doctor with information about the size and shape of your heart and how well your heart's chambers and valves are working. This procedure takes approximately one hour. There are no restrictions for this procedure. You may get an IV, if needed, to receive an ultrasound enhancing agent through to better visualize your heart.    2) Your physician has recommended that you wear an 14 DAY ZIO event monitor. Event monitors are medical devices that record the heart's electrical activity. Doctors most often [place] these monitors to diagnose arrhythmias. Arrhythmias are problems with the speed or rhythm of the heartbeat. The monitor is a small, portable device. You can wear one while you do your normal daily activities. This is usually used to diagnose what is causing palpitations/syncope (passing out). A Zio Patch Event Heart monitor will be applied to your chest today.  You will wear the patch for 14 days. After 24 hours, you may shower with the heart monitor on. If you feel any SYMPTOMS, you may press and release the button in the middle of the  monitor.    3) Your cardiac CT will be scheduled at one of the below locations:   [facility] [address] [place], [place] [address] (336) [place] [address] [place], [place] [address] [phone]  If scheduled at [facility], please arrive at the [facility] main entrance of [facility] 30-45 minutes prior to test start time. Proceed to the [facility] Radiology Department (first floor) to check-in and test prep.  If scheduled at [facility], please arrive 15 mins early for check-in and test prep.  Please follow these instructions carefully (unless otherwise directed):   On the Night Before the Test: . Be sure to Drink plenty of water. . Do not consume any caffeinated/decaffeinated beverages or chocolate 12 hours prior to your test. . Do not take any antihistamines 12 hours prior to your test.   On the Day of the Test: . Drink plenty of water. Do not drink any water within one hour of the test. . Do not eat any food 4 hours prior to the test. . You may take your regular medications prior to the test.  . Take metoprolol (Lopressor) two hours prior to test. . HOLD Furosemide/Hydrochlorothiazide morning of the test. . FEMALES- please wear underwire-free bra if available       After the Test: . Drink plenty of water. . After receiving IV contrast, you may experience a mild flushed feeling. This is normal. . On occasion, you may experience a  mild rash up to 24 hours after the test. This is not dangerous. If this occurs, you can take Benadryl 25 mg and increase your fluid intake. . If you experience trouble breathing, this can be serious. If it is severe call 911 IMMEDIATELY. If it is mild, please call our office. . If you take any of these medications: Glipizide/Metformin, Avandament, Glucavance, please do not take 48 hours after completing test unless  otherwise instructed.    Please contact the cardiac imaging nurse navigator should you have any questions/concerns Marchia Bond, RN Navigator Cardiac Imaging [facility] [phone] Office      Follow-Up: At [facility], you and your health needs are our priority.  As part of our continuing mission to provide you with exceptional heart care, we have created designated Provider Care Teams.  These Care Teams include your primary Cardiologist (physician) and Advanced Practice Providers (APPs -  Physician Assistants and Nurse Practitioners) who all work together to provide you with the care you need, when you need it.  Your next appointment:   2 MONTHS.  The format for your next appointment:   In Person  Provider:    You may see Kate Sable, MD or one of the following Advanced Practice Providers on your designated Care Team:    Murray Hodgkins, NP  Christell Faith, PA-C  Marrianne Mood, PA-C

## 2018-12-23 NOTE — Telephone Encounter (Signed)
Patient has a plan of care with cardiology. Declines hfu. Agrees to call PMD as needed.

## 2018-12-24 LAB — ECHOCARDIOGRAM COMPLETE
Height: 66 in
Weight: 2448 oz

## 2018-12-29 ENCOUNTER — Telehealth: Payer: Self-pay | Admitting: Cardiology

## 2018-12-29 NOTE — Telephone Encounter (Signed)
Message sent to Smith Robert @  [address] [facility] for an update.

## 2018-12-29 NOTE — Telephone Encounter (Signed)
Returned the patient's call.  Patient sts her full name on her voicemail with the ok to lmom.  lmom with the update below.    Trevor Mace, RN        We are still pending with her insurance - will reach out to [facility] to check the status

## 2018-12-29 NOTE — Telephone Encounter (Signed)
Patient states she has not heard anything regarding her cardiac CT being scheduled. Please call to discuss.

## 2019-01-02 NOTE — Telephone Encounter (Signed)
Patient is calling to check status of Cardiac CT scheduling

## 2019-01-05 ENCOUNTER — Telehealth (HOSPITAL_COMMUNITY): Payer: Self-pay | Admitting: Emergency Medicine

## 2019-01-05 NOTE — Telephone Encounter (Signed)
Left message on voicemail with name and callback number Madex Seals RN Navigator Cardiac Imaging [facility] [phone] Office [phone] Cell  

## 2019-01-05 NOTE — Telephone Encounter (Signed)
Pt returning phone call regarding upcoming cardiac imaging study; pt verbalizes understanding of appt date/time, parking situation and where to check in, pre-test NPO status and medications ordered, and verified current allergies; name and call back number provided for further questions should they arise Almena Hokenson RN Navigator Cardiac Imaging [facility] [phone] office [phone] cell   

## 2019-01-05 NOTE — Telephone Encounter (Signed)
Cardiac CT scheduled for 01/07/19

## 2019-01-07 ENCOUNTER — Ambulatory Visit (INDEPENDENT_AMBULATORY_CARE_PROVIDER_SITE_OTHER): Payer: Medicare HMO

## 2019-01-07 ENCOUNTER — Other Ambulatory Visit: Payer: Self-pay

## 2019-01-07 ENCOUNTER — Ambulatory Visit
Admission: RE | Admit: 2019-01-07 | Discharge: 2019-01-07 | Disposition: A | Discharge status: Home / Self Care | Payer: Medicare HMO | Source: Ambulatory Visit | Attending: Cardiology | Admitting: Cardiology

## 2019-01-07 DIAGNOSIS — K7689 Other specified diseases of liver: Secondary | ICD-10-CM | POA: Diagnosis not present

## 2019-01-07 DIAGNOSIS — R778 Other specified abnormalities of plasma proteins: Secondary | ICD-10-CM | POA: Insufficient documentation

## 2019-01-07 DIAGNOSIS — I700 Atherosclerosis of aorta: Secondary | ICD-10-CM | POA: Insufficient documentation

## 2019-01-07 DIAGNOSIS — R002 Palpitations: Secondary | ICD-10-CM

## 2019-01-07 DIAGNOSIS — R7989 Other specified abnormal findings of blood chemistry: Secondary | ICD-10-CM

## 2019-01-07 DIAGNOSIS — R911 Solitary pulmonary nodule: Secondary | ICD-10-CM | POA: Insufficient documentation

## 2019-01-07 MED ORDER — IOHEXOL 350 MG/ML SOLN
75.0000 mL | Freq: Once | INTRAVENOUS | Status: AC | PRN
  Administered 2019-01-07: 09:00:00 75 mL via INTRAVENOUS

## 2019-01-07 MED ORDER — NITROGLYCERIN 0.4 MG SL SUBL
0.8000 mg | SUBLINGUAL_TABLET | Freq: Once | SUBLINGUAL | Status: AC
  Administered 2019-01-07: 0.8 mg via SUBLINGUAL

## 2019-01-07 NOTE — Progress Notes (Signed)
Patient tolerated CT without incident. Did not want anything to eat or drink. Ambulatory to exit steady gait.

## 2019-01-23 DIAGNOSIS — R002 Palpitations: Secondary | ICD-10-CM | POA: Diagnosis not present

## 2019-01-30 ENCOUNTER — Other Ambulatory Visit: Payer: Self-pay | Admitting: Internal Medicine

## 2019-02-02 NOTE — Telephone Encounter (Signed)
Medication Refill - Medication: SYNTHROID 100 MCG tablet    Preferred Pharmacy (with phone number or street name):  [id number] - [place], [place] [phone] (Phone) [phone] (Fax)     Agent: Please be advised that RX refills may take up to 3 business days. We ask that you follow-up with your pharmacy.

## 2019-02-03 ENCOUNTER — Telehealth: Payer: Self-pay

## 2019-02-03 NOTE — Telephone Encounter (Signed)
-----   Message from Kate Sable, MD sent at 02/02/2019  5:58 PM EST ----- Patient had multiple episodes of SVTs.    Please start patient on Toprol XL 25 mg daily.  Have patient see EP for any additional input.  Thanks

## 2019-02-03 NOTE — Telephone Encounter (Signed)
lmov to schedule.  What is the dx ? I will enter the referral.

## 2019-02-03 NOTE — Telephone Encounter (Signed)
Patient made aware of her zio monitor results and Dr. Thereasa Solo recommendation. Patient is agreeable with having the consult with EP. Advised the patient that scheduling will contact her to schedule the appt with Dr. Caryl Comes.  Patient has a 02/05/19 appt with Dr. Mylo Red- [facility]. She would like to wait and discuss the starting Metoprolol with Dr. Garen Lah. Patient voiced appreciation for the call.

## 2019-02-03 NOTE — Telephone Encounter (Signed)
Dx SVT

## 2019-02-05 ENCOUNTER — Encounter: Payer: Self-pay | Admitting: Cardiology

## 2019-02-05 ENCOUNTER — Other Ambulatory Visit: Payer: Self-pay

## 2019-02-05 ENCOUNTER — Ambulatory Visit (INDEPENDENT_AMBULATORY_CARE_PROVIDER_SITE_OTHER): Payer: Medicare HMO | Admitting: Cardiology

## 2019-02-05 VITALS — BP 146/90 | HR 72 | Ht 66.0 in | Wt 154.5 lb

## 2019-02-05 DIAGNOSIS — I471 Supraventricular tachycardia: Secondary | ICD-10-CM

## 2019-02-05 DIAGNOSIS — R778 Other specified abnormalities of plasma proteins: Secondary | ICD-10-CM | POA: Diagnosis not present

## 2019-02-05 MED ORDER — METOPROLOL SUCCINATE ER 25 MG PO TB24: 25.0000 mg | ORAL_TABLET | Freq: Every day | ORAL | 6 refills | Status: DC

## 2019-02-05 NOTE — Progress Notes (Signed)
Cardiology Office Note:    Date:  02/05/2019   ID:  ZYKERA MROSS, DOB [birth year], MRN [id number]  PCP:  Kellie Mc, MD  Cardiologist:  Kate Sable, MD  Electrophysiologist:  None   Referring MD: Kellie Mc, MD   Chief Complaint  Patient presents with   office visit    2 mo F/U after echo and wearing ZIO monitor    History of Present Illness:    Kellie Roberson is a 73 y.o. female with a hx of hypothyroidism who presents for follow-up.  She was originally seen due to palpitations.    Patient had symptoms of palpitations  lasting about an hour.  She denies any dizziness, chest pain, shortness of breath with symptoms.  She also experienced her heart skipping a beat of brief episodes of her heart beating fast on and off for at least 5 years now, but never last this long.  She typically takes deep breaths to help with symptoms of palpitations.  She was eventually evaluated in the emergency room for symptoms, high-sensitivity troponins were elevated at 61.  2-week cardiac monitor, echo was ordered and patient presents for results.  Patient states her symptoms have been relatively the same.  Past Medical History:  Diagnosis Date   Cancer ([place]) 09/22/2018   skin ca-basal cell   Thyroid disease     Past Surgical History:  Procedure Laterality Date   ABDOMINAL HYSTERECTOMY  2003   total-had some structural damage after 2nd pregnancy   THYROIDECTOMY  1975   was hyperthyroid    Current Medications: Current Meds  Medication Sig   Ascorbic Acid (VITAMIN C) 100 MG tablet Take 100 mg by mouth daily as needed.   CLIMARA 0.025 MG/24HR patch NAME BRAND ONLY.  PATIENT WILL PAY OUT OF POCKET (Patient taking differently: Place onto the skin. APPLY BIWEEKLY. NAME BRAND ONLY.  PATIENT WILL PAY OUT OF POCKET)   meloxicam (MOBIC) 15 MG tablet Take 1 tablet (15 mg total) by mouth daily. (Patient taking differently: Take 15 mg by mouth daily as needed. )   Multiple Vitamin  (MULTIVITAMIN WITH MINERALS) TABS tablet Take 1 tablet by mouth daily.   Omega-3 Fatty Acids (FISH OIL) 1000 MG CAPS Take 1,000 mg by mouth daily.   Probiotic Product (PROBIOTIC-10 PO) Take by mouth daily.   SYNTHROID 100 MCG tablet TAKE 1 TABLET (100 MCG TOTAL) BY MOUTH DAILY. NAME BRAND ONLY     Allergies:   Patient has no known allergies.   Social History   Socioeconomic History   Marital status: Married    Spouse name: Not on file   Number of children: Not on file   Years of education: Not on file   Highest education level: Not on file  Occupational History   Not on file  Social Needs   Financial resource strain: Not hard at all   Food insecurity    Worry: Never true    Inability: Never true   Transportation needs    Medical: No    Non-medical: No  Tobacco Use   Smoking status: Never Smoker   Smokeless tobacco: Never Used  Substance and Sexual Activity   Alcohol use: Yes    Comment: social   Drug use: No   Sexual activity: Not on file  Lifestyle   Physical activity    Days per week: 5 days    Minutes per session: 60 min   Stress: Not at all  Relationships   Social connections  Talks on phone: Not on file    Gets together: Not on file    Attends religious service: Not on file    Active member of club or organization: Not on file    Attends meetings of clubs or organizations: Not on file    Relationship status: Not on file  Other Topics Concern   Not on file  Social History Narrative   Not on file     Family History: The patient's family history includes Alzheimer's disease in her mother; Glaucoma in her father; Hypertension in her father; Mitral valve prolapse in her sister; Thyroid disease in her sister. There is no history of Breast cancer.  ROS:   Please see the history of present illness.     All other systems reviewed and are negative.  EKGs/Labs/Other Studies Reviewed:    The following studies were reviewed today:  TTE  12/22/2018 1. Left ventricular ejection fraction, by visual estimation, is 55 to 60%. The left ventricle has normal function. Normal left ventricular size. There is no left ventricular hypertrophy.  2. Left ventricular diastolic Doppler parameters are consistent with impaired relaxation pattern of LV diastolic filling.  3. Global right ventricle has normal systolic function.The right ventricular size is normal. No increase in right ventricular wall thickness.  4. Left atrial size was normal.  5. Right atrial size was normal.  6. The mitral valve is normal in structure. Trace mitral valve regurgitation. No evidence of mitral stenosis.  7. The tricuspid valve is normal in structure. Tricuspid valve regurgitation was not visualized by color flow Doppler.  8. The aortic valve is normal in structure. Aortic valve regurgitation was not visualized by color flow Doppler. Structurally normal aortic valve, with no evidence of sclerosis or stenosis.  9. The pulmonic valve was normal in structure. Pulmonic valve regurgitation is trivial by color flow Doppler. 10. TR signal is inadequate for assessing pulmonary artery systolic pressure. 11. The inferior vena cava is normal in size with greater than 50% respiratory variability, suggesting right atrial pressure of 3 mmHg.  2-week cardiac monitor date 01/17/2019 Patient had a min HR of 54 bpm, max HR of 218 bpm, and avg HR of 83 bpm. Predominant underlying rhythm was Sinus Rhythm. 58 Supraventricular Tachycardia runs occurred, the run with the fastest interval lasting 6.5 secs with a max rate of 218 bpm, the longest lasting 18.5 secs with an avg rate of 174 bpm. Supraventricular Tachycardia was detected within +/- 45 seconds of symptomatic patient event(s). Isolated SVEs were rare (<1.0%), SVE Couplets were rare (<1.0%), and SVE Triplets were rare (<1.0%). Isolated VEs were frequent (9.8%, [id number]), VE Couplets were rare (<1.0%, 2573), and VE Triplets were rare  (<1.0%, 29). Ventricular Bigeminy and Trigeminy were present.  Coronary CTA date 01/07/2019  IMPRESSION: 1. Coronary calcium score of 44. This was 13 percentile for age and sex matched control.  2. Normal coronary origin with right dominance.  3. CAD-RADS 1. Minimal non-obstructive CAD (0-24%) in the distal left main/ostial LAD. Consider non-atherosclerotic causes of chest pain. Consider preventive therapy and risk factor modification.  4. PFO is present.  EKG:  EKG is  ordered today.  The ekg ordered today demonstrates normal sinus rhythm, left atrial enlargement, possible LVH per voltage criteria.  Recent Labs: 07/22/2018: ALT 27 12/20/2018: BUN 22; Creatinine, Ser 0.63; Hemoglobin 14.4; Magnesium 1.9; Platelets 192; Potassium 4.3; Sodium 139; TSH 0.889  Recent Lipid Panel    Component Value Date/Time   CHOL 224 (H) 07/22/2018 [id number]  CHOL 229 (H) 07/09/2017 0732   TRIG 73.0 07/22/2018 0819   HDL 81.90 07/22/2018 0819   HDL 86 07/09/2017 0732   CHOLHDL 3 07/22/2018 0819   VLDL 14.6 07/22/2018 0819   LDLCALC 127 (H) 07/22/2018 0819   LDLCALC 128 (H) 07/09/2017 0732    Physical Exam:    VS:  BP (!) 146/90 (BP Location: Left Arm, Patient Position: Sitting, Cuff Size: Normal)    Pulse 72    Ht 5\' 6"  (1.676 m)    Wt 154 lb 8 oz (70.1 kg)    SpO2 98%    BMI 24.94 kg/m     Wt Readings from Last 3 Encounters:  02/05/19 154 lb 8 oz (70.1 kg)  12/22/18 153 lb (69.4 kg)  12/20/18 115 lb (52.2 kg)     GEN:  Well nourished, well developed in no acute distress HEENT: Normal NECK: No JVD; No carotid bruits LYMPHATICS: No lymphadenopathy CARDIAC: RRR, no murmurs, rubs, gallops RESPIRATORY:  Clear to auscultation without rales, wheezing or rhonchi  ABDOMEN: Soft, non-tender, non-distended MUSCULOSKELETAL:  No edema; No deformity  SKIN: Warm and dry NEUROLOGIC:  Alert and oriented x 3 PSYCHIATRIC:  Normal affect   ASSESSMENT:   2-week cardiac monitor did reveal multiple  SVTs some lasting as much as 18 seconds.  Echocardiogram showed normal ejection fraction with impaired relaxation.  No structural abnormality was noted.  Elevated troponin is likely secondary to demand ischemia.  Coronary CTA with no significant stenosis noted, minimal nonobstructive CAD 0 to 24%.  Her blood pressure is elevated today, but usually normal.  We will continue to monitor on follow-up visits.  If persistently elevated, we will plan to start antihypertensive.  1. SVT (supraventricular tachycardia) (HCC)   2. Elevated troponin    PLAN:     1.  will start Toprol-XL 25 mg daily.  Refer to electrophysiology for further input.  Echocardiogram with preserved ejection fraction and grade 1 diastolic dysfunction.  2.  Likely secondary to demand ischemia/tachycardia.  No obstructive CAD noted on CTA coronaries.  Follow-up with me after seeing electrophysiology in about 4 to 5 months.  Total encounter time more than 40 minutes  Greater than 50% was spent in counseling and coordination of care with the patient  This note was generated in part or whole with voice recognition software. Voice recognition is usually quite accurate but there are transcription errors that can and very often do occur. I apologize for any typographical errors that were not detected and corrected.   Medication Adjustments/Labs and Tests Ordered: Current medicines are reviewed at length with the patient today.  Concerns regarding medicines are outlined above.  Orders Placed This Encounter  Procedures   EKG 12-Lead   Meds ordered this encounter  Medications   metoprolol succinate (TOPROL XL) 25 MG 24 hr tablet    Sig: Take 1 tablet (25 mg total) by mouth daily.    Dispense:  30 tablet    Refill:  6    There are no Patient Instructions on file for this visit.   Signed, Kate Sable, MD  02/05/2019 11:13 AM    [place]

## 2019-02-05 NOTE — Patient Instructions (Signed)
Medication Instructions:  Your physician has recommended you make the following change in your medication:  1- START Toprol XL 25 mg by mouth once a day.  *If you need a refill on your cardiac medications before your next appointment, please call your pharmacy*  Lab Work: none If you have labs (blood work) drawn today and your tests are completely normal, you will receive your results only by: Marland Kitchen MyChart Message (if you have MyChart) OR . A paper copy in the mail If you have any lab test that is abnormal or we need to change your treatment, we will call you to review the results.  Testing/Procedures: none  Follow-Up: At [facility], you and your health needs are our priority.  As part of our continuing mission to provide you with exceptional heart care, we have created designated Provider Care Teams.  These Care Teams include your primary Cardiologist (physician) and Advanced Practice Providers (APPs -  Physician Assistants and Nurse Practitioners) who all work together to provide you with the care you need, when you need it.  Your next appointment:   5 month(s)  Keep appointment with Dr Caryl Comes next week as scheduled.    The format for your next appointment:   In Person  Provider:    You may see Kate Sable, MD or one of the following Advanced Practice Providers on your designated Care Team:    Murray Hodgkins, NP  Christell Faith, PA-C  Marrianne Mood, PA-C

## 2019-02-12 NOTE — Progress Notes (Signed)
ELECTROPHYSIOLOGY CONSULT NOTE  Patient ID: Kellie Roberson, MRN: Roberson, DOB/AGE: 10/22/1945 73 y.o. Admit date: (Not on file) Date of Consult: 02/13/2019  Primary Physician: [name], MD Primary Cardiologist: [facility]     Kellie Roberson is a 73 y.o. female who is being seen today for the evaluation of Palpiations  at the request of [facility].    HPI Kellie Roberson is a 73 y.o. female referred for palpitations  She has a longstanding history of flutters.  She awakened on 12/20/2018 with 1 and half hours of rapid pounding heartbeats.  The next morning she took herself to the emergency room where a cardiac evaluation began a  Apart from that one night, the most bothersome was the worry engendered, no impact on functional status No LH chest pain sob  No provocateurs   Date PVC  11/20 9.8%      ECG LBBB Inferior Axis   Event Recorder personnally reviewed  PVCs and atrial tach 6.5>>18 sec  DATE TEST EF   10/20 Echo   55-60 %   CTA CTA  CaScore 44 LM calcification      Past Medical History:  Diagnosis Date  . Cancer ([place]) 09/22/2018   skin ca-basal cell  . Thyroid disease       Surgical History:  Past Surgical History:  Procedure Laterality Date  . ABDOMINAL HYSTERECTOMY  2003   total-had some structural damage after 2nd pregnancy  . THYROIDECTOMY  1975   was hyperthyroid     Home Meds: Current Meds  Medication Sig  . Ascorbic Acid (VITAMIN C) 100 MG tablet Take 100 mg by mouth daily as needed.  Marland Kitchen CLIMARA 0.025 MG/24HR patch NAME BRAND ONLY.  PATIENT WILL PAY OUT OF POCKET (Patient taking differently: Place onto the skin. APPLY BIWEEKLY. NAME BRAND ONLY.  PATIENT WILL PAY OUT OF POCKET)  . meloxicam (MOBIC) 15 MG tablet Take 1 tablet (15 mg total) by mouth daily. (Patient taking differently: Take 15 mg by mouth daily as needed. )  . metoprolol succinate (TOPROL XL) 25 MG 24 hr tablet Take 1 tablet (25 mg total) by mouth daily.  . Multiple Vitamin (MULTIVITAMIN  WITH MINERALS) TABS tablet Take 1 tablet by mouth daily.  . Omega-3 Fatty Acids (FISH OIL) 1000 MG CAPS Take 1,000 mg by mouth daily.  . Probiotic Product (PROBIOTIC-10 PO) Take by mouth daily.  Marland Kitchen SYNTHROID 100 MCG tablet TAKE 1 TABLET (100 MCG TOTAL) BY MOUTH DAILY. NAME BRAND ONLY    Allergies: No Known Allergies  Social History   Socioeconomic History  . Marital status: Married    Spouse name: Not on file  . Number of children: Not on file  . Years of education: Not on file  . Highest education level: Not on file  Occupational History  . Not on file  Tobacco Use  . Smoking status: Never Smoker  . Smokeless tobacco: Never Used  Substance and Sexual Activity  . Alcohol use: Yes    Comment: social  . Drug use: No  . Sexual activity: Not on file  Other Topics Concern  . Not on file  Social History Narrative  . Not on file   Social Determinants of Health   Financial Resource Strain: Low Risk   . Difficulty of Paying Living Expenses: Not hard at all  Food Insecurity: No Food Insecurity  . Worried About Charity fundraiser in the Last Year: Never true  . Ran Out  of Food in the Last Year: Never true  Transportation Needs: No Transportation Needs  . Lack of Transportation (Medical): No  . Lack of Transportation (Non-Medical): No  Physical Activity: Sufficiently Active  . Days of Exercise per Week: 5 days  . Minutes of Exercise per Session: 60 min  Stress: No Stress Concern Present  . Feeling of Stress : Not at all  Social Connections:   . Frequency of Communication with Friends and Family: Not on file  . Frequency of Social Gatherings with Friends and Family: Not on file  . Attends Religious Services: Not on file  . Active Member of Clubs or Organizations: Not on file  . Attends Archivist Meetings: Not on file  . Marital Status: Not on file  Intimate Partner Violence: Not At Risk  . Fear of Current or Ex-Partner: No  . Emotionally Abused: No  . Physically  Abused: No  . Sexually Abused: No     Family History  Problem Relation Age of Onset  . Alzheimer's disease Mother   . Hypertension Father   . Glaucoma Father   . Thyroid disease Sister   . Mitral valve prolapse Sister   . Breast cancer Neg Hx      ROS:  Please see the history of present illness.     All other systems reviewed and negative.    Physical Exam  Blood pressure 128/76, pulse 72, height 5\' 6"  (1.676 m), weight 154 lb 4 oz (70 kg), SpO2 98 %. General: Well developed, well nourished female in no acute distress. Head: Normocephalic, atraumatic, sclera non-icteric, no xanthomas, nares are without discharge. EENT: normal  Lymph Nodes:  none Neck: Negative for carotid bruits. JVD not elevated. Back:without scoliosis kyphosis Lungs: Clear bilaterally to auscultation without wheezes, rales, or rhonchi. Breathing is unlabored. Heart: RRR with S1 S2. No murmur . No rubs, or gallops appreciated. Abdomen: Soft, non-tender, non-distended with normoactive bowel sounds. No hepatomegaly. No rebound/guarding. No obvious abdominal masses. Msk:  Strength and tone appear normal for age. Extremities: No clubbing or cyanosis. edema.  Distal pedal pulses are 2+ and equal bilaterally. Skin: Warm and Dry Neuro: Alert and oriented X 3. CN III-XII intact Grossly normal sensory and motor function . Psych:  Responds to questions appropriately with a normal affect.      Labs: Cardiac Enzymes No results for input(s): CKTOTAL, CKMB, TROPONINI in the last 72 hours. CBC Lab Results  Component Value Date   WBC 4.4 12/20/2018   HGB 14.4 12/20/2018   HCT 42.3 12/20/2018   MCV 93.8 12/20/2018   PLT 192 12/20/2018   PROTIME: No results for input(s): LABPROT, INR in the last 72 hours. Chemistry No results for input(s): NA, K, CL, CO2, BUN, CREATININE, CALCIUM, PROT, BILITOT, ALKPHOS, ALT, AST, GLUCOSE in the last 168 hours.  Invalid input(s): LABALBU Lipids Lab Results  Component Value Date     CHOL 224 (H) 07/22/2018   HDL 81.90 07/22/2018   LDLCALC 127 (H) 07/22/2018   TRIG 73.0 07/22/2018   BNP No results found for: PROBNP Thyroid Function Tests: No results for input(s): TSH, T4TOTAL, T3FREE, THYROIDAB in the last 72 hours.  Invalid input(s): FREET3 Miscellaneous No results found for: DDIMER  Radiology/Studies:  No results found.  EKG: sinus  72 13/09/40 PVC LBBB-Infer axis which matches the pattern on the event recorder A left bundle superior axis PVC was also noted   Assessment and Plan:  PVCs-frequent-RVOT  SVT-atrial tachycardia-nonsustained  Coronary artery calcification with an  intermediate cholesterol risk profile    The patient's PVCs are largely attenuated by the metoprolol which was recently started.  The burden on examination is considerably less than anticipated based on the event recorder.  I suspect that there is indeed quantitative suppression.  This has impact on the potential for the PVC burden to cause a PVC cardiomyopathy.  At this juncture her imaging demonstrated normal LV function.  We discussed treatment strategies including maintaining her current medication, antiarrhythmic therapy or catheter ablation.  She is having no untoward with the beta-blocker.  Given the burden of her PVCs I suggested that she continue it.  The episode of tachycardia on the night of 10/17 is either ventricular or supraventricular both of which have been identified in this lady.  I recommended the use of an AliveCor monitor to record such an event if it happens again  Her lipid status is such that her risk score is intermediate at 10.5%.  According to [facility] guidelines with her calcium score been abnormal, she might be considered for statin therapy.  I will defer this to Dr. Waldron Session  We will see her as needed  Virl Axe

## 2019-02-13 ENCOUNTER — Other Ambulatory Visit: Payer: Self-pay

## 2019-02-13 ENCOUNTER — Ambulatory Visit (INDEPENDENT_AMBULATORY_CARE_PROVIDER_SITE_OTHER): Payer: Medicare HMO | Admitting: Internal Medicine

## 2019-02-13 ENCOUNTER — Encounter: Payer: Self-pay | Admitting: Internal Medicine

## 2019-02-13 VITALS — BP 128/76 | HR 72 | Ht 66.0 in | Wt 154.2 lb

## 2019-02-13 DIAGNOSIS — I471 Supraventricular tachycardia: Secondary | ICD-10-CM | POA: Diagnosis not present

## 2019-02-13 NOTE — Patient Instructions (Signed)
Medication Instructions:  - Your physician recommends that you continue on your current medications as directed. Please refer to the Current Medication list given to you today.  *If you need a refill on your cardiac medications before your next appointment, please call your pharmacy*  Lab Work: - none ordered  If you have labs (blood work) drawn today and your tests are completely normal, you will receive your results only by: Marland Kitchen MyChart Message (if you have MyChart) OR . A paper copy in the mail If you have any lab test that is abnormal or we need to change your treatment, we will call you to review the results.  Testing/Procedures: - none ordered  Follow-Up: At [facility], you and your health needs are our priority.  As part of our continuing mission to provide you with exceptional heart care, we have created designated Provider Care Teams.  These Care Teams include your primary Cardiologist (physician) and Advanced Practice Providers (APPs -  Physician Assistants and Nurse Practitioners) who all work together to provide you with the care you need, when you need it.  Your next appointment:   As needed   The format for your next appointment:   n/a  Provider:   Virl Axe, MD  Other Instructions n/a

## 2019-02-20 ENCOUNTER — Ambulatory Visit: Payer: Medicare HMO | Admitting: Cardiology

## 2019-03-09 DIAGNOSIS — H0011 Chalazion right upper eyelid: Secondary | ICD-10-CM | POA: Diagnosis not present

## 2019-03-30 ENCOUNTER — Telehealth: Payer: Self-pay

## 2019-03-30 DIAGNOSIS — Z1211 Encounter for screening for malignant neoplasm of colon: Secondary | ICD-10-CM

## 2019-03-30 NOTE — Telephone Encounter (Signed)
Pt sent a message stating that she is do for a colonoscopy.

## 2019-04-01 NOTE — Telephone Encounter (Signed)
Can you call her back and find out if she has a preference whom she sees  FYI   This should be asked of all patients requesting ay kind of referrals,  Because the referrals are now time consuming to create  because they are SPECIFIC to provider and have to be redone if patient changes their minds about who they want to see  If they have no preference who, then if there is a preference WHERE   Needs to be asked  For GI  I will default to either [place] or anyone at [place] if patient does not have a state preference

## 2019-04-01 NOTE — Telephone Encounter (Signed)
Spoke with pt and she stated that it was discussed at last visit that she would be okay to do the cologuard. I have placed the order and put the form in quick sign folder.

## 2019-04-01 NOTE — Addendum Note (Signed)
Addended by: Adair Laundry on: 04/01/2019 04:15 PM   Modules accepted: Orders

## 2019-06-17 ENCOUNTER — Other Ambulatory Visit: Payer: Self-pay | Admitting: Internal Medicine

## 2019-07-20 ENCOUNTER — Ambulatory Visit (INDEPENDENT_AMBULATORY_CARE_PROVIDER_SITE_OTHER): Payer: Medicare HMO | Admitting: Cardiology

## 2019-07-20 ENCOUNTER — Other Ambulatory Visit: Payer: Self-pay

## 2019-07-20 ENCOUNTER — Encounter: Payer: Self-pay | Admitting: Cardiology

## 2019-07-20 VITALS — BP 130/80 | HR 73 | Ht 66.0 in | Wt 153.5 lb

## 2019-07-20 DIAGNOSIS — E7800 Pure hypercholesterolemia, unspecified: Secondary | ICD-10-CM | POA: Diagnosis not present

## 2019-07-20 DIAGNOSIS — I471 Supraventricular tachycardia: Secondary | ICD-10-CM

## 2019-07-20 NOTE — Progress Notes (Signed)
Cardiology Office Note:    Date:  07/20/2019   ID:  Kellie Roberson, DOB [birth year], MRN [id number]  PCP:  [name], MD  Cardiologist:  Kate Sable, MD  Electrophysiologist:  None   Referring MD: [name], MD   Chief Complaint  Patient presents with  . office visit    5 month F/U; Meds verbally reviewed with patient.    History of Present Illness:    Kellie Roberson is a 74 y.o. female with a hx of hypothyroidism, paroxysmal SVT who presents for follow-up.  She was last seen due to palpitations, cardiac monitor revealed multiple SVTs lasting as long as 18 seconds.  Toprol-XL was started and patient referred to electrophysiology.  Transthoracic echo showed normal systolic and diastolic function, EF 55 to 60%.  Coronary CTA had a calcium score of 44, minimal nonobstructive CAD in the distal left main/ostial LAD 0 to 24%.  Symptoms of improved since starting Toprol-XL she states feeling much better and tolerating beta-blocker okay.  Saw electrophysiology who recommended patient continue Toprol as prescribed.     Past Medical History:  Diagnosis Date  . Cancer ([place]) 09/22/2018   skin ca-basal cell  . Thyroid disease     Past Surgical History:  Procedure Laterality Date  . ABDOMINAL HYSTERECTOMY  2003   total-had some structural damage after 2nd pregnancy  . THYROIDECTOMY  1975   was hyperthyroid    Current Medications: Current Meds  Medication Sig  . Ascorbic Acid (VITAMIN C) 100 MG tablet Take 100 mg by mouth daily as needed.  Marland Kitchen CLIMARA 0.025 MG/24HR patch NAME BRAND ONLY.  PATIENT WILL PAY OUT OF POCKET (Patient taking differently: Place onto the skin. APPLY BIWEEKLY. NAME BRAND ONLY.  PATIENT WILL PAY OUT OF POCKET)  . meloxicam (MOBIC) 15 MG tablet TAKE 1 TABLET BY MOUTH EVERY DAY (Patient taking differently: Take 15 mg by mouth daily as needed. )  . metoprolol succinate (TOPROL XL) 25 MG 24 hr tablet Take 1 tablet (25 mg total) by mouth daily.  . Multiple  Vitamin (MULTIVITAMIN WITH MINERALS) TABS tablet Take 1 tablet by mouth daily.  . Omega-3 Fatty Acids (FISH OIL) 1000 MG CAPS Take 1,000 mg by mouth daily.  . Probiotic Product (PROBIOTIC-10 PO) Take by mouth daily.  Marland Kitchen SYNTHROID 100 MCG tablet TAKE 1 TABLET (100 MCG TOTAL) BY MOUTH DAILY. NAME BRAND ONLY     Allergies:   Patient has no known allergies.   Social History   Socioeconomic History  . Marital status: Married    Spouse name: Not on file  . Number of children: Not on file  . Years of education: Not on file  . Highest education level: Not on file  Occupational History  . Not on file  Tobacco Use  . Smoking status: Never Smoker  . Smokeless tobacco: Never Used  Substance and Sexual Activity  . Alcohol use: Yes    Comment: social  . Drug use: No  . Sexual activity: Not on file  Other Topics Concern  . Not on file  Social History Narrative  . Not on file   Social Determinants of Health   Financial Resource Strain: Low Risk   . Difficulty of Paying Living Expenses: Not hard at all  Food Insecurity: No Food Insecurity  . Worried About Charity fundraiser in the Last Year: Never true  . Ran Out of Food in the Last Year: Never true  Transportation Needs: No Transportation Needs  .  Lack of Transportation (Medical): No  . Lack of Transportation (Non-Medical): No  Physical Activity: Sufficiently Active  . Days of Exercise per Week: 5 days  . Minutes of Exercise per Session: 60 min  Stress: No Stress Concern Present  . Feeling of Stress : Not at all  Social Connections:   . Frequency of Communication with Friends and Family:   . Frequency of Social Gatherings with Friends and Family:   . Attends Religious Services:   . Active Member of Clubs or Organizations:   . Attends Archivist Meetings:   Marland Kitchen Marital Status:      Family History: The patient's family history includes Alzheimer's disease in her mother; Glaucoma in her father; Hypertension in her father;  Mitral valve prolapse in her sister; Thyroid disease in her sister. There is no history of Breast cancer.  ROS:   Please see the history of present illness.     All other systems reviewed and are negative.  EKGs/Labs/Other Studies Reviewed:    The following studies were reviewed today:   EKG:  EKG is  ordered today.  The ekg ordered today demonstrates normal sinus rhythm, occasional PVCs.  Recent Labs: 07/22/2018: ALT 27 12/20/2018: BUN 22; Creatinine, Ser 0.63; Hemoglobin 14.4; Magnesium 1.9; Platelets 192; Potassium 4.3; Sodium 139; TSH 0.889  Recent Lipid Panel    Component Value Date/Time   CHOL 224 (H) 07/22/2018 0819   CHOL 229 (H) 07/09/2017 0732   TRIG 73.0 07/22/2018 0819   HDL 81.90 07/22/2018 0819   HDL 86 07/09/2017 0732   CHOLHDL 3 07/22/2018 0819   VLDL 14.6 07/22/2018 0819   LDLCALC 127 (H) 07/22/2018 0819   LDLCALC 128 (H) 07/09/2017 0732    Physical Exam:    VS:  BP 130/80 (BP Location: Left Arm, Patient Position: Sitting, Cuff Size: Normal)   Pulse 73   Ht 5\' 6"  (1.676 m)   Wt 153 lb 8 oz (69.6 kg)   SpO2 97%   BMI 24.78 kg/m     Wt Readings from Last 3 Encounters:  07/20/19 153 lb 8 oz (69.6 kg)  02/13/19 154 lb 4 oz (70 kg)  02/05/19 154 lb 8 oz (70.1 kg)     GEN:  Well nourished, well developed in no acute distress HEENT: Normal NECK: No JVD; No carotid bruits LYMPHATICS: No lymphadenopathy CARDIAC: RRR, no murmurs, rubs, gallops RESPIRATORY:  Clear to auscultation without rales, wheezing or rhonchi  ABDOMEN: Soft, non-tender, non-distended MUSCULOSKELETAL:  No edema; No deformity  SKIN: Warm and dry NEUROLOGIC:  Alert and oriented x 3 PSYCHIATRIC:  Normal affect   ASSESSMENT:     1. SVT (supraventricular tachycardia) ([place])   2. Pure hypercholesterolemia    PLAN:     1.  History of paroxysmal SVT, PVCs, continue Toprol-XL.  Echocardiogram with normal systolic and diastolic function, EF 55 to 60%.  Coronary CTA with no significant  stenosis, 0 to 24% in the distal left main/ostial LAD  2.  Patient with history of hyperlipidemia, 10-year ASCVD risk of 11.9%.  She meets criteria for moderate to high intensity statin.  Patient would like to try low-cholesterol diet prior to starting medications which I think is reasonable.  Low-cholesterol diet advised, information will be given to patient.  Repeat fasting lipid profile in 3 months.  Follow-up in 3 months after repeat lipid panel.  This note was generated in part or whole with voice recognition software. Voice recognition is usually quite accurate but there are transcription errors  that can and very often do occur. I apologize for any typographical errors that were not detected and corrected.   Medication Adjustments/Labs and Tests Ordered: Current medicines are reviewed at length with the patient today.  Concerns regarding medicines are outlined above.  Orders Placed This Encounter  Procedures  . Lipid panel  . EKG 12-Lead   No orders of the defined types were placed in this encounter.   Patient Instructions  Medication Instructions:  Your physician recommends that you continue on your current medications as directed. Please refer to the Current Medication list given to you today.  *If you need a refill on your cardiac medications before your next appointment, please call your pharmacy*   Lab Work: Your physician recommends that you return for a FASTING lipid profile: in 3 months. Have your lab drawn 2-3 days prior to your next appointment.  Please have your lab drawn at the medial mall. You do not need an appointment. Lab hours are Mon- Fri 7am- 6pm.  If you have labs (blood work) drawn today and your tests are completely normal, you will receive your results only by: Marland Kitchen MyChart Message (if you have MyChart) OR . A paper copy in the mail If you have any lab test that is abnormal or we need to change your treatment, we will call you to review the  results.   Testing/Procedures: None ordered   Follow-Up: At [facility], you and your health needs are our priority.  As part of our continuing mission to provide you with exceptional heart care, we have created designated Provider Care Teams.  These Care Teams include your primary Cardiologist (physician) and Advanced Practice Providers (APPs -  Physician Assistants and Nurse Practitioners) who all work together to provide you with the care you need, when you need it.  We recommend signing up for the patient portal called "MyChart".  Sign up information is provided on this After Visit Summary.  MyChart is used to connect with patients for Virtual Visits (Telemedicine).  Patients are able to view lab/test results, encounter notes, upcoming appointments, etc.  Non-urgent messages can be sent to your provider as well.   To learn more about what you can do with MyChart, go to [email or web address].    Your next appointment:   3 month(s)  The format for your next appointment:   In Person  Provider:    You may see Kate Sable, MD or one of the following Advanced Practice Providers on your designated Care Team:    Murray Hodgkins, NP  Christell Faith, PA-C  Marrianne Mood, PA-C    Other Instructions  Fat and Cholesterol Restricted Eating Plan Getting too much fat and cholesterol in your diet may cause health problems. Choosing the right foods helps keep your fat and cholesterol at normal levels. This can keep you from getting certain diseases.  What are tips for following this plan?  Meal planning  At meals, divide your plate into four equal parts: ? Fill one-half of your plate with vegetables and green salads. ? Fill one-fourth of your plate with whole grains. ? Fill one-fourth of your plate with low-fat (lean) protein foods.  Eat fish that is high in omega-3 fats at least two times a week. This includes mackerel, tuna, sardines, and salmon.  Eat foods that are high  in fiber, such as whole grains, beans, apples, broccoli, carrots, peas, and barley. General tips   Work with your doctor to lose weight if you need to.  Avoid: ?  Foods with added sugar. ? Fried foods. ? Foods with partially hydrogenated oils.  Limit alcohol intake to no more than 1 drink a day for nonpregnant women and 2 drinks a day for men. One drink equals 12 oz of beer, 5 oz of wine, or 1 oz of hard liquor. Reading food labels  Check food labels for: ? Trans fats. ? Partially hydrogenated oils. ? Saturated fat (g) in each serving. ? Cholesterol (mg) in each serving. ? Fiber (g) in each serving.  Choose foods with healthy fats, such as: ? Monounsaturated fats. ? Polyunsaturated fats. ? Omega-3 fats.  Choose grain products that have whole grains. Look for the word "whole" as the first word in the ingredient list. Cooking  Cook foods using low-fat methods. These include baking, boiling, grilling, and broiling.  Eat more home-cooked foods. Eat at restaurants and buffets less often.  Avoid cooking using saturated fats, such as butter, cream, palm oil, palm kernel oil, and coconut oil. Recommended foods  Fruits  All fresh, canned (in natural juice), or frozen fruits. Vegetables  Fresh or frozen vegetables (raw, steamed, roasted, or grilled). Green salads. Grains  Whole grains, such as whole wheat or whole grain breads, crackers, cereals, and pasta. Unsweetened oatmeal, bulgur, barley, quinoa, or brown rice. Corn or whole wheat flour tortillas. Meats and other protein foods  Ground beef (85% or leaner), grass-fed beef, or beef trimmed of fat. Skinless chicken or [place]. Ground chicken or [place]. Pork trimmed of fat. All fish and seafood. Egg whites. Dried beans, peas, or lentils. Unsalted nuts or seeds. Unsalted canned beans. Nut butters without added sugar or oil. Dairy  Low-fat or nonfat dairy products, such as skim or 1% milk, 2% or reduced-fat cheeses, low-fat and  fat-free ricotta or cottage cheese, or plain low-fat and nonfat yogurt. Fats and oils  Tub margarine without trans fats. Light or reduced-fat mayonnaise and salad dressings. Avocado. Olive, canola, sesame, or safflower oils. The items listed above may not be a complete list of foods and beverages you can eat. Contact a dietitian for more information. Foods to avoid Fruits  Canned fruit in heavy syrup. Fruit in cream or butter sauce. Fried fruit. Vegetables  Vegetables cooked in cheese, cream, or butter sauce. Fried vegetables. Grains  White bread. White pasta. White rice. Cornbread. Bagels, pastries, and croissants. Crackers and snack foods that contain trans fat and hydrogenated oils. Meats and other protein foods  Fatty cuts of meat. Ribs, chicken wings, bacon, sausage, bologna, salami, chitterlings, fatback, hot dogs, bratwurst, and packaged lunch meats. Liver and organ meats. Whole eggs and egg yolks. Chicken and [place] with skin. Fried meat. Dairy  Whole or 2% milk, cream, half-and-half, and cream cheese. Whole milk cheeses. Whole-fat or sweetened yogurt. Full-fat cheeses. Nondairy creamers and whipped toppings. Processed cheese, cheese spreads, and cheese curds. Beverages  Alcohol. Sugar-sweetened drinks such as sodas, lemonade, and fruit drinks. Fats and oils  Butter, stick margarine, lard, shortening, ghee, or bacon fat. Coconut, palm kernel, and palm oils. Sweets and desserts  Corn syrup, sugars, honey, and molasses. Candy. Jam and jelly. Syrup. Sweetened cereals. Cookies, pies, cakes, donuts, muffins, and ice cream. The items listed above may not be a complete list of foods and beverages you should avoid. Contact a dietitian for more information. Summary  Choosing the right foods helps keep your fat and cholesterol at normal levels. This can keep you from getting certain diseases.  At meals, fill one-half of your plate with vegetables and green salads.  Eat high-fiber  foods, like whole grains, beans, apples, carrots, peas, and barley.  Limit added sugar, saturated fats, alcohol, and fried foods. This information is not intended to replace advice given to you by your health care provider. Make sure you discuss any questions you have with your health care provider. Document Revised: 10/23/2017 Document Reviewed: 11/06/2016 Elsevier Patient Education  2020 [place], Kate Sable, MD  07/20/2019 10:57 AM    [place]

## 2019-07-20 NOTE — Patient Instructions (Signed)
Medication Instructions:  Your physician recommends that you continue on your current medications as directed. Please refer to the Current Medication list given to you today.  *If you need a refill on your cardiac medications before your next appointment, please call your pharmacy*   Lab Work: Your physician recommends that you return for a FASTING lipid profile: in 3 months. Have your lab drawn 2-3 days prior to your next appointment.  Please have your lab drawn at the medial mall. You do not need an appointment. Lab hours are Mon- Fri 7am- 6pm.  If you have labs (blood work) drawn today and your tests are completely normal, you will receive your results only by: Marland Kitchen MyChart Message (if you have MyChart) OR . A paper copy in the mail If you have any lab test that is abnormal or we need to change your treatment, we will call you to review the results.   Testing/Procedures: None ordered   Follow-Up: At [facility], you and your health needs are our priority.  As part of our continuing mission to provide you with exceptional heart care, we have created designated Provider Care Teams.  These Care Teams include your primary Cardiologist (physician) and Advanced Practice Providers (APPs -  Physician Assistants and Nurse Practitioners) who all work together to provide you with the care you need, when you need it.  We recommend signing up for the patient portal called "MyChart".  Sign up information is provided on this After Visit Summary.  MyChart is used to connect with patients for Virtual Visits (Telemedicine).  Patients are able to view lab/test results, encounter notes, upcoming appointments, etc.  Non-urgent messages can be sent to your provider as well.   To learn more about what you can do with MyChart, go to [email or web address].    Your next appointment:   3 month(s)  The format for your next appointment:   In Person  Provider:    You may see Kate Sable, MD or one of  the following Advanced Practice Providers on your designated Care Team:    Murray Hodgkins, NP  Christell Faith, PA-C  Marrianne Mood, PA-C    Other Instructions  Fat and Cholesterol Restricted Eating Plan Getting too much fat and cholesterol in your diet may cause health problems. Choosing the right foods helps keep your fat and cholesterol at normal levels. This can keep you from getting certain diseases.  What are tips for following this plan?  Meal planning  At meals, divide your plate into four equal parts: ? Fill one-half of your plate with vegetables and green salads. ? Fill one-fourth of your plate with whole grains. ? Fill one-fourth of your plate with low-fat (lean) protein foods.  Eat fish that is high in omega-3 fats at least two times a week. This includes mackerel, tuna, sardines, and salmon.  Eat foods that are high in fiber, such as whole grains, beans, apples, broccoli, carrots, peas, and barley. General tips   Work with your doctor to lose weight if you need to.  Avoid: ? Foods with added sugar. ? Fried foods. ? Foods with partially hydrogenated oils.  Limit alcohol intake to no more than 1 drink a day for nonpregnant women and 2 drinks a day for men. One drink equals 12 oz of beer, 5 oz of wine, or 1 oz of hard liquor. Reading food labels  Check food labels for: ? Trans fats. ? Partially hydrogenated oils. ? Saturated fat (g) in each serving. ? Cholesterol (  mg) in each serving. ? Fiber (g) in each serving.  Choose foods with healthy fats, such as: ? Monounsaturated fats. ? Polyunsaturated fats. ? Omega-3 fats.  Choose grain products that have whole grains. Look for the word "whole" as the first word in the ingredient list. Cooking  Cook foods using low-fat methods. These include baking, boiling, grilling, and broiling.  Eat more home-cooked foods. Eat at restaurants and buffets less often.  Avoid cooking using saturated fats, such as  butter, cream, palm oil, palm kernel oil, and coconut oil. Recommended foods  Fruits  All fresh, canned (in natural juice), or frozen fruits. Vegetables  Fresh or frozen vegetables (raw, steamed, roasted, or grilled). Green salads. Grains  Whole grains, such as whole wheat or whole grain breads, crackers, cereals, and pasta. Unsweetened oatmeal, bulgur, barley, quinoa, or brown rice. Corn or whole wheat flour tortillas. Meats and other protein foods  Ground beef (85% or leaner), grass-fed beef, or beef trimmed of fat. Skinless chicken or [place]. Ground chicken or [place]. Pork trimmed of fat. All fish and seafood. Egg whites. Dried beans, peas, or lentils. Unsalted nuts or seeds. Unsalted canned beans. Nut butters without added sugar or oil. Dairy  Low-fat or nonfat dairy products, such as skim or 1% milk, 2% or reduced-fat cheeses, low-fat and fat-free ricotta or cottage cheese, or plain low-fat and nonfat yogurt. Fats and oils  Tub margarine without trans fats. Light or reduced-fat mayonnaise and salad dressings. Avocado. Olive, canola, sesame, or safflower oils. The items listed above may not be a complete list of foods and beverages you can eat. Contact a dietitian for more information. Foods to avoid Fruits  Canned fruit in heavy syrup. Fruit in cream or butter sauce. Fried fruit. Vegetables  Vegetables cooked in cheese, cream, or butter sauce. Fried vegetables. Grains  White bread. White pasta. White rice. Cornbread. Bagels, pastries, and croissants. Crackers and snack foods that contain trans fat and hydrogenated oils. Meats and other protein foods  Fatty cuts of meat. Ribs, chicken wings, bacon, sausage, bologna, salami, chitterlings, fatback, hot dogs, bratwurst, and packaged lunch meats. Liver and organ meats. Whole eggs and egg yolks. Chicken and [place] with skin. Fried meat. Dairy  Whole or 2% milk, cream, half-and-half, and cream cheese. Whole milk cheeses. Whole-fat  or sweetened yogurt. Full-fat cheeses. Nondairy creamers and whipped toppings. Processed cheese, cheese spreads, and cheese curds. Beverages  Alcohol. Sugar-sweetened drinks such as sodas, lemonade, and fruit drinks. Fats and oils  Butter, stick margarine, lard, shortening, ghee, or bacon fat. Coconut, palm kernel, and palm oils. Sweets and desserts  Corn syrup, sugars, honey, and molasses. Candy. Jam and jelly. Syrup. Sweetened cereals. Cookies, pies, cakes, donuts, muffins, and ice cream. The items listed above may not be a complete list of foods and beverages you should avoid. Contact a dietitian for more information. Summary  Choosing the right foods helps keep your fat and cholesterol at normal levels. This can keep you from getting certain diseases.  At meals, fill one-half of your plate with vegetables and green salads.  Eat high-fiber foods, like whole grains, beans, apples, carrots, peas, and barley.  Limit added sugar, saturated fats, alcohol, and fried foods. This information is not intended to replace advice given to you by your health care provider. Make sure you discuss any questions you have with your health care provider. Document Revised: 10/23/2017 Document Reviewed: 11/06/2016 Elsevier Patient Education  [place].

## 2019-07-24 ENCOUNTER — Other Ambulatory Visit: Payer: Self-pay | Admitting: Internal Medicine

## 2019-08-20 ENCOUNTER — Other Ambulatory Visit: Payer: Self-pay

## 2019-08-20 MED ORDER — METOPROLOL SUCCINATE ER 25 MG PO TB24: 25.0000 mg | ORAL_TABLET | Freq: Every day | ORAL | 6 refills | Status: DC

## 2019-09-22 DIAGNOSIS — L578 Other skin changes due to chronic exposure to nonionizing radiation: Secondary | ICD-10-CM | POA: Diagnosis not present

## 2019-09-22 DIAGNOSIS — Z85828 Personal history of other malignant neoplasm of skin: Secondary | ICD-10-CM | POA: Diagnosis not present

## 2019-09-22 DIAGNOSIS — Z872 Personal history of diseases of the skin and subcutaneous tissue: Secondary | ICD-10-CM | POA: Diagnosis not present

## 2019-09-22 DIAGNOSIS — L738 Other specified follicular disorders: Secondary | ICD-10-CM | POA: Diagnosis not present

## 2019-09-29 DIAGNOSIS — R69 Illness, unspecified: Secondary | ICD-10-CM | POA: Diagnosis not present

## 2019-10-07 ENCOUNTER — Ambulatory Visit: Payer: Medicare HMO

## 2019-10-08 ENCOUNTER — Other Ambulatory Visit
Admission: RE | Admit: 2019-10-08 | Discharge: 2019-10-08 | Disposition: A | Discharge status: Home / Self Care | Payer: Medicare HMO | Attending: Cardiology | Admitting: Cardiology

## 2019-10-08 ENCOUNTER — Telehealth: Payer: Self-pay

## 2019-10-08 DIAGNOSIS — E7800 Pure hypercholesterolemia, unspecified: Secondary | ICD-10-CM | POA: Diagnosis not present

## 2019-10-08 LAB — LIPID PANEL
Cholesterol: 213 mg/dL — ABNORMAL HIGH (ref 0–200)
HDL: 75 mg/dL (ref >40)
LDL Cholesterol: 123 mg/dL — ABNORMAL HIGH (ref 0–99)
Total CHOL/HDL Ratio: 2.8 RATIO
Triglycerides: 77 mg/dL (ref <150)
VLDL: 15 mg/dL (ref 0–40)

## 2019-10-08 NOTE — Telephone Encounter (Signed)
  Patient Consent for Virtual Visit         Kellie Roberson has provided verbal consent on 10/08/2019 for a virtual visit (video or telephone).   CONSENT FOR VIRTUAL VISIT FOR:  Kellie Roberson  By participating in this virtual visit I agree to the following:  I hereby voluntarily request, consent and authorize [facility] and its employed or contracted physicians, physician assistants, nurse practitioners or other licensed health care professionals (the Practitioner), to provide me with telemedicine health care services (the "Services") as deemed necessary by the treating Practitioner. I acknowledge and consent to receive the Services by the Practitioner via telemedicine. I understand that the telemedicine visit will involve communicating with the Practitioner through live audiovisual communication technology and the disclosure of certain medical information by electronic transmission. I acknowledge that I have been given the opportunity to request an in-person assessment or other available alternative prior to the telemedicine visit and am voluntarily participating in the telemedicine visit.  I understand that I have the right to withhold or withdraw my consent to the use of telemedicine in the course of my care at any time, without affecting my right to future care or treatment, and that the Practitioner or I may terminate the telemedicine visit at any time. I understand that I have the right to inspect all information obtained and/or recorded in the course of the telemedicine visit and may receive copies of available information for a reasonable fee.  I understand that some of the potential risks of receiving the Services via telemedicine include:  Marland Kitchen Delay or interruption in medical evaluation due to technological equipment failure or disruption; . Information transmitted may not be sufficient (e.g. poor resolution of images) to allow for appropriate medical decision making by the Practitioner;  and/or  . In rare instances, security protocols could fail, causing a breach of personal health information.  Furthermore, I acknowledge that it is my responsibility to provide information about my medical history, conditions and care that is complete and accurate to the best of my ability. I acknowledge that Practitioner's advice, recommendations, and/or decision may be based on factors not within their control, such as incomplete or inaccurate data provided by me or distortions of diagnostic images or specimens that may result from electronic transmissions. I understand that the practice of medicine is not an exact science and that Practitioner makes no warranties or guarantees regarding treatment outcomes. I acknowledge that a copy of this consent can be made available to me via my patient portal ([facility]), or I can request a printed copy by calling the office of [facility].    I understand that my insurance will be billed for this visit.   I have read or had this consent read to me. . I understand the contents of this consent, which adequately explains the benefits and risks of the Services being provided via telemedicine.  . I have been provided ample opportunity to ask questions regarding this consent and the Services and have had my questions answered to my satisfaction. . I give my informed consent for the services to be provided through the use of telemedicine in my medical care

## 2019-10-09 ENCOUNTER — Ambulatory Visit: Payer: Medicare HMO

## 2019-10-12 ENCOUNTER — Other Ambulatory Visit: Payer: Self-pay

## 2019-10-12 ENCOUNTER — Encounter: Payer: Self-pay | Admitting: Cardiology

## 2019-10-12 ENCOUNTER — Telehealth (INDEPENDENT_AMBULATORY_CARE_PROVIDER_SITE_OTHER): Payer: Medicare HMO | Admitting: Cardiology

## 2019-10-12 ENCOUNTER — Ambulatory Visit (INDEPENDENT_AMBULATORY_CARE_PROVIDER_SITE_OTHER): Payer: Medicare HMO

## 2019-10-12 VITALS — Ht 66.0 in | Wt 150.0 lb

## 2019-10-12 DIAGNOSIS — Z0000 Encounter for general adult medical examination without abnormal findings: Secondary | ICD-10-CM | POA: Diagnosis not present

## 2019-10-12 DIAGNOSIS — I471 Supraventricular tachycardia: Secondary | ICD-10-CM | POA: Diagnosis not present

## 2019-10-12 DIAGNOSIS — Z1211 Encounter for screening for malignant neoplasm of colon: Secondary | ICD-10-CM

## 2019-10-12 DIAGNOSIS — E7800 Pure hypercholesterolemia, unspecified: Secondary | ICD-10-CM

## 2019-10-12 NOTE — Progress Notes (Addendum)
Subjective:   Kellie Roberson is a 74 y.o. female who presents for Medicare Annual (Subsequent) preventive examination.  Review of Systems    No ROS.  Medicare Wellness Virtual Visit.   Cardiac Risk Factors include: advanced age (>26men, >41 women)     Objective:    Today's Vitals   10/12/19 1336  Weight: 150 lb (68 kg)  Height: 5\' 6"  (1.676 m)   Body mass index is 24.21 kg/m.  Advanced Directives 10/12/2019 12/20/2018 10/06/2018  Does Patient Have a Medical Advance Directive? Yes No Yes  Type of Paramedic of [place];Living will - Living will;Healthcare Power of Attorney  Does patient want to make changes to medical advance directive? No - Patient declined - No - Patient declined  Copy of [place] in Chart? No - copy requested - No - copy requested    Current Medications (verified) Outpatient Encounter Medications as of 10/12/2019  Medication Sig  . Ascorbic Acid (VITAMIN C) 100 MG tablet Take 100 mg by mouth daily as needed.  Marland Kitchen CLIMARA 0.025 MG/24HR patch NAME BRAND ONLY.  PATIENT WILL PAY OUT OF POCKET (Patient taking differently: Place onto the skin. APPLY BIWEEKLY. NAME BRAND ONLY.  PATIENT WILL PAY OUT OF POCKET)  . meloxicam (MOBIC) 15 MG tablet TAKE 1 TABLET BY MOUTH EVERY DAY (Patient taking differently: Take 15 mg by mouth daily as needed. )  . metoprolol succinate (TOPROL XL) 25 MG 24 hr tablet Take 1 tablet (25 mg total) by mouth daily.  . Multiple Vitamin (MULTIVITAMIN WITH MINERALS) TABS tablet Take 1 tablet by mouth daily.  . Omega-3 Fatty Acids (FISH OIL) 1000 MG CAPS Take 1,000 mg by mouth daily.  . Probiotic Product (PROBIOTIC-10 PO) Take by mouth daily.  Marland Kitchen SYNTHROID 100 MCG tablet TAKE 1 TABLET (100 MCG TOTAL) BY MOUTH DAILY. NAME BRAND ONLY   No facility-administered encounter medications on file as of 10/12/2019.    Allergies (verified) Patient has no known allergies.   History: Past Medical History:  Diagnosis  Date  . Cancer ([facility]) 09/22/2018   skin ca-basal cell  . Thyroid disease    Past Surgical History:  Procedure Laterality Date  . ABDOMINAL HYSTERECTOMY  2003   total-had some structural damage after 2nd pregnancy  . THYROIDECTOMY  1975   was hyperthyroid   Family History  Problem Relation Age of Onset  . Alzheimer's disease Mother   . Hypertension Father   . Glaucoma Father   . Thyroid disease Sister   . Mitral valve prolapse Sister   . Breast cancer Neg Hx    Social History   Socioeconomic History  . Marital status: Married    Spouse name: Not on file  . Number of children: Not on file  . Years of education: Not on file  . Highest education level: Not on file  Occupational History  . Not on file  Tobacco Use  . Smoking status: Never Smoker  . Smokeless tobacco: Never Used  Vaping Use  . Vaping Use: Never used  Substance and Sexual Activity  . Alcohol use: Yes    Comment: social  . Drug use: No  . Sexual activity: Not on file  Other Topics Concern  . Not on file  Social History Narrative  . Not on file   Social Determinants of Health   Financial Resource Strain:   . Difficulty of Paying Living Expenses:   Food Insecurity: No Food Insecurity  . Worried About Running  Out of Food in the Last Year: Never true  . Ran Out of Food in the Last Year: Never true  Transportation Needs: No Transportation Needs  . Lack of Transportation (Medical): No  . Lack of Transportation (Non-Medical): No  Physical Activity:   . Days of Exercise per Week:   . Minutes of Exercise per Session:   Stress: No Stress Concern Present  . Feeling of Stress : Not at all  Social Connections: Unknown  . Frequency of Communication with Friends and Family: More than three times a week  . Frequency of Social Gatherings with Friends and Family: More than three times a week  . Attends Religious Services: Not on file  . Active Member of Clubs or Organizations: Not on file  . Attends Theatre manager Meetings: Not on file  . Marital Status: Married    Tobacco Counseling Counseling given: Not Answered   Clinical Intake:  Pre-visit preparation completed: Yes        Diabetes: No  How often do you need to have someone help you when you read instructions, pamphlets, or other written materials from your doctor or pharmacy?: 1 - Never   Interpreter Needed?: No      Activities of Daily Living In your present state of health, do you have any difficulty performing the following activities: 10/12/2019  Hearing? N  Vision? N  Difficulty concentrating or making decisions? N  Walking or climbing stairs? N  Dressing or bathing? N  Doing errands, shopping? N  Preparing Food and eating ? N  Using the Toilet? N  In the past six months, have you accidently leaked urine? N  Do you have problems with loss of bowel control? N  Managing your Medications? N  Managing your Finances? N  Housekeeping or managing your Housekeeping? N  Some recent data might be hidden    Patient Care Team: [name], MD as PCP - General (Internal Medicine) Kate Sable, MD as PCP - Cardiology (Cardiology)  Indicate any recent Medical Services you may have received from other than [facility] providers in the past year (date may be approximate).     Assessment:   This is a routine wellness examination for Kellie Roberson.  I connected with Kellie Roberson today by telephone and verified that I am speaking with the correct person using two identifiers. Location patient: home Location provider: work Persons participating in the virtual visit: patient, Kellie Roberson.    I discussed the limitations, risks, security and privacy concerns of performing an evaluation and management service by telephone and the availability of in person appointments. The patient expressed understanding and verbally consented to this telephonic visit.    Interactive audio and video telecommunications were attempted between this  provider and patient, however failed, due to patient having technical difficulties OR patient did not have access to video capability.  We continued and completed visit with audio only.  Some vital signs may be absent or patient reported.   Hearing/Vision screen  Hearing Screening   125Hz  250Hz  500Hz  1000Hz  2000Hz  3000Hz  4000Hz  6000Hz  8000Hz   Right ear:           Left ear:           Comments: Patient is able to hear conversational tones without difficulty.  No issues reported.  Vision Screening Comments: Followed by Dr. Ellin Mayhew Wears corrective lenses Visual acuity not assessed, virtual visit.  They have seen their ophthalmologist in the last 12 months.    Dietary issues and exercise activities  discussed: Current Exercise Habits: Home exercise routine, Type of exercise: walking;strength training/weights;calisthenics (Hiking. [employer]. Online weight classes.), Time (Minutes): 60, Frequency (Times/Week): 5, Weekly Exercise (Minutes/Week): 300, Intensity: Moderate  Healthy diet Good water intake  Goals    . Follow up with Primary Care Provider     As needed      Depression Screen PHQ 2/9 Scores 10/12/2019 10/06/2018 07/25/2018 04/23/2016  PHQ - 2 Score 0 0 0 0  PHQ- 9 Score - - 0 -    Fall Risk Fall Risk  10/12/2019 11/17/2018 10/06/2018 07/25/2018 04/23/2016  Falls in the past year? 0 0 0 0 No  Number falls in past yr: 0 - - - -  Follow up Falls evaluation completed Falls evaluation completed - - -   Handrails in use when climbing stairs? Yes  Home free of loose throw rugs in walkways, pet beds, electrical cords, etc? Yes  Adequate lighting in your home to reduce risk of falls? Yes   ASSISTIVE DEVICES UTILIZED TO PREVENT FALLS:  Life alert? No  Use of a cane, walker or w/c? No  Grab bars in the bathroom? No  Shower chair or bench in shower? No  Elevated toilet seat or a handicapped toilet? No   TIMED UP AND GO:  Was the test performed? No . Virtual visit.   Cognitive  Function: MMSE - Mini Mental State Exam 10/12/2019  Not completed: Unable to complete  Patient is alert and oriented x3.  She enjoys learning new things.  Denies difficulty focusing, concentrating, making decisions, memory loss.    6CIT Screen 10/06/2018  What Year? 0 points  What month? 0 points  What time? 0 points  Count back from 20 0 points  Months in reverse 0 points  Repeat phrase 0 points  Total Score 0    Immunizations Immunization History  Administered Date(s) Administered  . Fluad Quad(high Dose 65+) 11/15/2018  . Moderna SARS-COVID-2 Vaccination 04/06/2019, 05/04/2019  . Pneumococcal Conjugate-13 11/04/2015  . Pneumococcal Polysaccharide-23 07/11/2017  . Td 03/05/2010    Health Maintenance Health Maintenance  Topic Date Due  . Hepatitis C Screening  Never done  . COLONOSCOPY  10/09/2017  . INFLUENZA VACCINE  10/04/2019  . TETANUS/TDAP  03/05/2020  . MAMMOGRAM  09/22/2020  . DEXA SCAN  Completed  . COVID-19 Vaccine  Completed  . PNA vac Low Risk Adult  Discontinued    Cologuard- consent given  Hepatitis C Screening- deferred   Dental Screening: Recommended annual dental exams for proper oral hygiene. Visits every 6 months.   Community Resource Referral / Chronic Care Management: CRR required this visit?  No   CCM required this visit?  No      Plan:   Keep all routine maintenance appointments.   Follow up 10/28/19 @ 8:00  I have personally reviewed and noted the following in the patient's chart:   . Medical and social history . Use of alcohol, tobacco or illicit drugs  . Current medications and supplements . Functional ability and status . Nutritional status . Physical activity . Advanced directives . List of other physicians . Hospitalizations, surgeries, and ER visits in previous 12 months . Vitals . Screenings to include cognitive, depression, and falls . Referrals and appointments  In addition, I have reviewed and discussed with  patient certain preventive protocols, quality metrics, and best practice recommendations. A written personalized care plan for preventive services as well as general preventive health recommendations were provided to patient via mychart.  Varney Biles, LPN   04/13/209      Agree with plan. Mable Paris, NP

## 2019-10-12 NOTE — Patient Instructions (Addendum)
Kellie Roberson , Thank you for taking time to come for your Medicare Wellness Visit. I appreciate your ongoing commitment to your health goals. Please review the following plan we discussed and let me know if I can assist you in the future.   These are the goals we discussed: Goals    . Follow up with Primary Care Provider     As needed       This is a list of the screening recommended for you and due dates:  Health Maintenance  Topic Date Due  .  Hepatitis C: One time screening is recommended by Center for Disease Control  (CDC) for  adults born from [birth year] through 1963.   Never done  . Colon Cancer Screening  10/09/2017  . Flu Shot  10/04/2019  . Tetanus Vaccine  03/05/2020  . Mammogram  09/22/2020  . DEXA scan (bone density measurement)  Completed  . COVID-19 Vaccine  Completed  . Pneumonia vaccines  Discontinued    Immunizations Immunization History  Administered Date(s) Administered  . Fluad Quad(high Dose 65+) 11/15/2018  . Moderna SARS-COVID-2 Vaccination 04/06/2019, 05/04/2019  . Pneumococcal Conjugate-13 11/04/2015  . Pneumococcal Polysaccharide-23 07/11/2017  . Td 03/05/2010   Keep all routine maintenance appointments.   Follow up 10/28/19 @ 8:00  Conditions/risks identified: none new  Follow up in one year for your annual wellness visit.  Cologuard ordered.    Preventive Care 43 Years and Older, Female Preventive care refers to lifestyle choices and visits with your health care provider that can promote health and wellness. What does preventive care include?  A yearly physical exam. This is also called an annual well check.  Dental exams once or twice a year.  Routine eye exams. Ask your health care provider how often you should have your eyes checked.  Personal lifestyle choices, including:  Daily care of your teeth and gums.  Regular physical activity.  Eating a healthy diet.  Avoiding tobacco and drug use.  Limiting alcohol use.  Practicing safe  sex.  Taking low-dose aspirin every day.  Taking vitamin and mineral supplements as recommended by your health care provider. What happens during an annual well check? The services and screenings done by your health care provider during your annual well check will depend on your age, overall health, lifestyle risk factors, and family history of disease. Counseling  Your health care provider may ask you questions about your:  Alcohol use.  Tobacco use.  Drug use.  Emotional well-being.  Home and relationship well-being.  Sexual activity.  Eating habits.  History of falls.  Memory and ability to understand (cognition).  Work and work Statistician.  Reproductive health. Screening  You may have the following tests or measurements:  Height, weight, and BMI.  Blood pressure.  Lipid and cholesterol levels. These may be checked every 5 years, or more frequently if you are over 76 years old.  Skin check.  Lung cancer screening. You may have this screening every year starting at age 23 if you have a 30-pack-year history of smoking and currently smoke or have quit within the past 15 years.  Fecal occult blood test (FOBT) of the stool. You may have this test every year starting at age 13.  Flexible sigmoidoscopy or colonoscopy. You may have a sigmoidoscopy every 5 years or a colonoscopy every 10 years starting at age 102.  Hepatitis C blood test.  Hepatitis B blood test.  Sexually transmitted disease (STD) testing.  Diabetes screening. This is done  by checking your blood sugar (glucose) after you have not eaten for a while (fasting). You may have this done every 1-3 years.  Bone density scan. This is done to screen for osteoporosis. You may have this done starting at age 69.  Mammogram. This may be done every 1-2 years. Talk to your health care provider about how often you should have regular mammograms. Talk with your health care provider about your test results,  treatment options, and if necessary, the need for more tests. Vaccines  Your health care provider may recommend certain vaccines, such as:  Influenza vaccine. This is recommended every year.  Tetanus, diphtheria, and acellular pertussis (Tdap, Td) vaccine. You may need a Td booster every 10 years.  Zoster vaccine. You may need this after age 70.  Pneumococcal 13-valent conjugate (PCV13) vaccine. One dose is recommended after age 73.  Pneumococcal polysaccharide (PPSV23) vaccine. One dose is recommended after age 46. Talk to your health care provider about which screenings and vaccines you need and how often you need them. This information is not intended to replace advice given to you by your health care provider. Make sure you discuss any questions you have with your health care provider. Document Released: 03/18/2015 Document Revised: 11/09/2015 Document Reviewed: 12/21/2014 Elsevier Interactive Patient Education  2017 [place] Prevention in the Home Falls can cause injuries. They can happen to people of all ages. There are many things you can do to make your home safe and to help prevent falls. What can I do on the outside of my home?  Regularly fix the edges of walkways and driveways and fix any cracks.  Remove anything that might make you trip as you walk through a door, such as a raised step or threshold.  Trim any bushes or trees on the path to your home.  Use bright outdoor lighting.  Clear any walking paths of anything that might make someone trip, such as rocks or tools.  Regularly check to see if handrails are loose or broken. Make sure that both sides of any steps have handrails.  Any raised decks and porches should have guardrails on the edges.  Have any leaves, snow, or ice cleared regularly.  Use sand or salt on walking paths during winter.  Clean up any spills in your garage right away. This includes oil or grease spills. What can I do in the  bathroom?  Use night lights.  Install grab bars by the toilet and in the tub and shower. Do not use towel bars as grab bars.  Use non-skid mats or decals in the tub or shower.  If you need to sit down in the shower, use a plastic, non-slip stool.  Keep the floor dry. Clean up any water that spills on the floor as soon as it happens.  Remove soap buildup in the tub or shower regularly.  Attach bath mats securely with double-sided non-slip rug tape.  Do not have throw rugs and other things on the floor that can make you trip. What can I do in the bedroom?  Use night lights.  Make sure that you have a light by your bed that is easy to reach.  Do not use any sheets or blankets that are too big for your bed. They should not hang down onto the floor.  Have a firm chair that has side arms. You can use this for support while you get dressed.  Do not have throw rugs and other things on  the floor that can make you trip. What can I do in the kitchen?  Clean up any spills right away.  Avoid walking on wet floors.  Keep items that you use a lot in easy-to-reach places.  If you need to reach something above you, use a strong step stool that has a grab bar.  Keep electrical cords out of the way.  Do not use floor polish or wax that makes floors slippery. If you must use wax, use non-skid floor wax.  Do not have throw rugs and other things on the floor that can make you trip. What can I do with my stairs?  Do not leave any items on the stairs.  Make sure that there are handrails on both sides of the stairs and use them. Fix handrails that are broken or loose. Make sure that handrails are as long as the stairways.  Check any carpeting to make sure that it is firmly attached to the stairs. Fix any carpet that is loose or worn.  Avoid having throw rugs at the top or bottom of the stairs. If you do have throw rugs, attach them to the floor with carpet tape.  Make sure that you have a  light switch at the top of the stairs and the bottom of the stairs. If you do not have them, ask someone to add them for you. What else can I do to help prevent falls?  Wear shoes that:  Do not have high heels.  Have rubber bottoms.  Are comfortable and fit you well.  Are closed at the toe. Do not wear sandals.  If you use a stepladder:  Make sure that it is fully opened. Do not climb a closed stepladder.  Make sure that both sides of the stepladder are locked into place.  Ask someone to hold it for you, if possible.  Clearly mark and make sure that you can see:  Any grab bars or handrails.  First and last steps.  Where the edge of each step is.  Use tools that help you move around (mobility aids) if they are needed. These include:  Canes.  Walkers.  Scooters.  Crutches.  Turn on the lights when you go into a dark area. Replace any light bulbs as soon as they burn out.  Set up your furniture so you have a clear path. Avoid moving your furniture around.  If any of your floors are uneven, fix them.  If there are any pets around you, be aware of where they are.  Review your medicines with your doctor. Some medicines can make you feel dizzy. This can increase your chance of falling. Ask your doctor what other things that you can do to help prevent falls. This information is not intended to replace advice given to you by your health care provider. Make sure you discuss any questions you have with your health care provider. Document Released: 12/16/2008 Document Revised: 07/28/2015 Document Reviewed: 03/26/2014 Elsevier Interactive Patient Education  2017 [employer].

## 2019-10-12 NOTE — Progress Notes (Signed)
Virtual Visit via Telephone Note   This visit type was conducted due to national recommendations for restrictions regarding the COVID-19 Pandemic (e.g. social distancing) in an effort to limit this patient's exposure and mitigate transmission in our community.  Due to her co-morbid illnesses, this patient is at least at moderate risk for complications without adequate follow up.  This format is felt to be most appropriate for this patient at this time.  The patient did not have access to video technology/had technical difficulties with video requiring transitioning to audio format only (telephone).  All issues noted in this document were discussed and addressed.  No physical exam could be performed with this format.  Please refer to the patient's chart for her  consent to telehealth for [facility].   Date:  10/12/2019   ID:  Kellie Roberson, DOB 09-29-1945, MRN [id number]  Patient Location: Home Provider Location: Office/Clinic  PCP:  [name], MD  Cardiologist:  Kate Sable, MD  Electrophysiologist:  None   Evaluation Performed:  Follow-Up Visit  Chief Complaint:  Elevated lipids  History of Present Illness:    Kellie Roberson is a 74 y.o. female with hyperlipidemia, paroxysmal SVT being evaluated for follow-up.  She has a history of palpitations, cardiac monitor showed paroxysmal SVTs.  She was started on Toprol-XL which has resolved patient's symptoms.  Echocardiogram showed normal systolic and diastolic function.  Patient also with hyperlipidemia, statin was recommended after last visit, she wanted to try diet and exercise.  Repeat panel was obtained.  She otherwise feels well, has no concerns at this time.  The patient does not have symptoms concerning for COVID-19 infection (fever, chills, cough, or new shortness of breath).    Past Medical History:  Diagnosis Date  . Cancer ([place]) 09/22/2018   skin ca-basal cell  . Thyroid disease    Past Surgical History:   Procedure Laterality Date  . ABDOMINAL HYSTERECTOMY  2003   total-had some structural damage after 2nd pregnancy  . THYROIDECTOMY  1975   was hyperthyroid     Current Meds  Medication Sig  . Ascorbic Acid (VITAMIN C) 100 MG tablet Take 100 mg by mouth daily as needed.  Marland Kitchen CLIMARA 0.025 MG/24HR patch NAME BRAND ONLY.  PATIENT WILL PAY OUT OF POCKET (Patient taking differently: Place onto the skin. APPLY BIWEEKLY. NAME BRAND ONLY.  PATIENT WILL PAY OUT OF POCKET)  . meloxicam (MOBIC) 15 MG tablet TAKE 1 TABLET BY MOUTH EVERY DAY (Patient taking differently: Take 15 mg by mouth daily as needed. )  . metoprolol succinate (TOPROL XL) 25 MG 24 hr tablet Take 1 tablet (25 mg total) by mouth daily.  . Multiple Vitamin (MULTIVITAMIN WITH MINERALS) TABS tablet Take 1 tablet by mouth daily.  . Omega-3 Fatty Acids (FISH OIL) 1000 MG CAPS Take 1,000 mg by mouth daily.  . Probiotic Product (PROBIOTIC-10 PO) Take by mouth daily.  Marland Kitchen SYNTHROID 100 MCG tablet TAKE 1 TABLET (100 MCG TOTAL) BY MOUTH DAILY. NAME BRAND ONLY     Allergies:   Patient has no known allergies.   Social History   Tobacco Use  . Smoking status: Never Smoker  . Smokeless tobacco: Never Used  Vaping Use  . Vaping Use: Never used  Substance Use Topics  . Alcohol use: Yes    Comment: social  . Drug use: No     Family Hx: The patient's family history includes Alzheimer's disease in her mother; Glaucoma in her father; Hypertension in  her father; Mitral valve prolapse in her sister; Thyroid disease in her sister. There is no history of Breast cancer.  ROS:   Please see the history of present illness.     All other systems reviewed and are negative.   Prior CV studies:   The following studies were reviewed today:  Prior echocardiogram.  Labs/Other Tests and Data Reviewed:    EKG:  No ECG reviewed.  Recent Labs: 12/20/2018: BUN 22; Creatinine, Ser 0.63; Hemoglobin 14.4; Magnesium 1.9; Platelets 192; Potassium 4.3;  Sodium 139; TSH 0.889   Recent Lipid Panel Lab Results  Component Value Date/Time   CHOL 213 (H) 10/08/2019 08:22 AM   CHOL 229 (H) 07/09/2017 07:32 AM   TRIG 77 10/08/2019 08:22 AM   HDL 75 10/08/2019 08:22 AM   HDL 86 07/09/2017 07:32 AM   CHOLHDL 2.8 10/08/2019 08:22 AM   LDLCALC 123 (H) 10/08/2019 08:22 AM   LDLCALC 128 (H) 07/09/2017 07:32 AM    Wt Readings from Last 3 Encounters:  10/12/19 150 lb (68 kg)  07/20/19 153 lb 8 oz (69.6 kg)  02/13/19 154 lb 4 oz (70 kg)     Objective:    Vital Signs:  Ht 5\' 6"  (1.676 m)   Wt 150 lb (68 kg)   BMI 24.21 kg/m    VITAL SIGNS:  reviewed  ASSESSMENT & PLAN:    1. History of paroxysmal SVT.  Denies any symptoms of palpitations since starting Toprol-XL 25 mg daily.  Continue beta-blocker as prescribed.  Previous echo was normal. 2. History of hyperlipidemia, 10-year ASCVD risk of 11%.  Statin therapy was recommended, patient tried diet and exercise with minimal improvements.  Statin therapy is again recommended, but she wants to continue with diet and exercise for couple of months more.  Plan to repeat fasting lipid profile in 6 months.  Follow-up after fasting lipid profile.  Follow-up in 6 months.  COVID-19 Education: The signs and symptoms of COVID-19 were discussed with the patient and how to seek care for testing (follow up with PCP or arrange E-visit).  The importance of social distancing was discussed today.  Time:   Today, I have spent 40 minutes with the patient with telehealth technology discussing the above problems.     Medication Adjustments/Labs and Tests Ordered: Current medicines are reviewed at length with the patient today.  Concerns regarding medicines are outlined above.   Tests Ordered: No orders of the defined types were placed in this encounter.   Medication Changes: No orders of the defined types were placed in this encounter.   Follow Up:  In Person in 6 month(s) after fasting lipid  profile.  Signed, Kate Sable, MD  10/12/2019 1:03 PM    [place]

## 2019-10-12 NOTE — Patient Instructions (Signed)
Medication Instructions:  Your physician recommends that you continue on your current medications as directed. Please refer to the Current Medication list given to you today.  *If you need a refill on your cardiac medications before your next appointment, please call your pharmacy*   Lab Work:  Your physician recommends that you return for a FASTING lipid profile:  In 6 months (just prior to your 6 month follow up visit)  - You will need to be fasting. Please do not have anything to eat or drink after midnight the morning you have the lab work. You may only have water or black coffee with no cream or sugar. - Please go to the [facility]. You will check in at the front desk to the right as you walk into the atrium. Valet Parking is offered if needed. - No appointment needed. You may go any day between 7 am and 6 pm.   Testing/Procedures: None Ordered   Follow-Up: At [facility], you and your health needs are our priority.  As part of our continuing mission to provide you with exceptional heart care, we have created designated Provider Care Teams.  These Care Teams include your primary Cardiologist (physician) and Advanced Practice Providers (APPs -  Physician Assistants and Nurse Practitioners) who all work together to provide you with the care you need, when you need it.  We recommend signing up for the patient portal called "MyChart".  Sign up information is provided on this After Visit Summary.  MyChart is used to connect with patients for Virtual Visits (Telemedicine).  Patients are able to view lab/test results, encounter notes, upcoming appointments, etc.  Non-urgent messages can be sent to your provider as well.   To learn more about what you can do with MyChart, go to [email or web address].    Your next appointment:   6 months     The format for your next appointment:   In person or Virtual  Provider:    Kate Sable, MD   Other Instructions

## 2019-10-13 ENCOUNTER — Other Ambulatory Visit: Payer: Self-pay | Admitting: Internal Medicine

## 2019-10-13 DIAGNOSIS — Z1231 Encounter for screening mammogram for malignant neoplasm of breast: Secondary | ICD-10-CM

## 2019-10-27 ENCOUNTER — Other Ambulatory Visit: Payer: Self-pay

## 2019-10-27 ENCOUNTER — Ambulatory Visit
Admission: RE | Admit: 2019-10-27 | Discharge: 2019-10-27 | Disposition: A | Discharge status: Home / Self Care | Payer: Medicare HMO | Source: Ambulatory Visit | Attending: Internal Medicine | Admitting: Internal Medicine

## 2019-10-27 DIAGNOSIS — Z1231 Encounter for screening mammogram for malignant neoplasm of breast: Secondary | ICD-10-CM | POA: Diagnosis not present

## 2019-10-28 ENCOUNTER — Encounter: Payer: Self-pay | Admitting: Internal Medicine

## 2019-10-28 ENCOUNTER — Ambulatory Visit (INDEPENDENT_AMBULATORY_CARE_PROVIDER_SITE_OTHER): Payer: Medicare HMO | Admitting: Internal Medicine

## 2019-10-28 ENCOUNTER — Other Ambulatory Visit: Payer: Self-pay

## 2019-10-28 VITALS — BP 144/88 | HR 60 | Temp 98.1°F | Resp 14 | Ht 66.0 in | Wt 152.8 lb

## 2019-10-28 DIAGNOSIS — Z1211 Encounter for screening for malignant neoplasm of colon: Secondary | ICD-10-CM | POA: Diagnosis not present

## 2019-10-28 DIAGNOSIS — E890 Postprocedural hypothyroidism: Secondary | ICD-10-CM

## 2019-10-28 DIAGNOSIS — R030 Elevated blood-pressure reading, without diagnosis of hypertension: Secondary | ICD-10-CM | POA: Diagnosis not present

## 2019-10-28 DIAGNOSIS — Z0000 Encounter for general adult medical examination without abnormal findings: Secondary | ICD-10-CM

## 2019-10-28 DIAGNOSIS — I2584 Coronary atherosclerosis due to calcified coronary lesion: Secondary | ICD-10-CM

## 2019-10-28 DIAGNOSIS — Z13820 Encounter for screening for osteoporosis: Secondary | ICD-10-CM

## 2019-10-28 DIAGNOSIS — I493 Ventricular premature depolarization: Secondary | ICD-10-CM | POA: Diagnosis not present

## 2019-10-28 MED ORDER — ZOSTER VAC RECOMB ADJUVANTED 50 MCG/0.5ML IM SUSR: 0.5000 mL | Freq: Once | INTRAMUSCULAR | 1 refills | Status: AC

## 2019-10-28 NOTE — Assessment & Plan Note (Signed)

## 2019-10-28 NOTE — Progress Notes (Signed)
Patient ID: Kellie Roberson, female    DOB: [birth year]  Age: 74 y.o. MRN: [id number]  The patient is here for annual wellness examination and management of other chronic and acute problems.   The risk factors are reflected in the social history.  The roster of all physicians providing medical care to patient - is listed in the Snapshot section of the chart.  Activities of daily living:  The patient is 100% independent in all ADLs: dressing, toileting, feeding as well as independent mobility  Home safety : The patient has smoke detectors in the home. They wear seatbelts.  There are no firearms at home. There is no violence in the home.   There is no risks for hepatitis, STDs or HIV. There is no   history of blood transfusion. They have no travel history to infectious disease endemic areas of the world.  The patient has seen their dentist in the last six month. They have seen their eye doctor in the last year. They admit to slight hearing difficulty with regard to whispered voices and some television programs.  They have deferred audiologic testing in the last year.  They do not  have excessive sun exposure. Discussed the need for sun protection: hats, long sleeves and use of sunscreen if there is significant sun exposure.   Diet: the importance of a healthy diet is discussed. They do have a healthy diet.  The benefits of regular aerobic exercise were discussed. She works outside in the garden and swims daily .   Depression screen: there are no signs or vegative symptoms of depression- irritability, change in appetite, anhedonia, sadness/tearfullness.  Cognitive assessment: the patient manages all their financial and personal affairs and is actively engaged. They could relate day,date,year and events; recalled 3/3 objects at 3 minutes; performed clock-face test normally.  The following portions of the patient's history were reviewed and updated as appropriate: allergies, current medications, past  family history, past medical history,  past surgical history, past social history  and problem list.  Visual acuity was not assessed per patient preference since she has regular follow up with her ophthalmologist. Hearing and body mass index were assessed and reviewed.   During the course of the visit the patient was educated and counseled about appropriate screening and preventive services including : fall prevention , diabetes screening, nutrition counseling, colorectal cancer screening, and recommended immunizations.    CC: The primary encounter diagnosis was Colon cancer screening. Diagnoses of Postoperative hypothyroidism, Screening for osteoporosis, PVC (premature ventricular contraction), White coat syndrome with high blood pressure but without hypertension, Coronary atherosclerosis due to calcified coronary lesion (CODE), and Encounter for preventive health examination were also pertinent to this visit.  1) recurrent styes  Ever since her skin cancer biopsy and excision of scalp was complicated by Staph infection.. using Hypochlorous acid bid per ophthalmology 2) recurrent episode of SVT   Seen by Urgent Care then sent to ED.  Now seeing Agbor Etang and taking low dose toprol xl with no recurrence of PVCs.  ECHO done  3) coronary atherosclerosis:  0 to 24%  By cardiac CT. Statin was recommended by both me and Agbor Etang but deferred by patient. Prefers RYR  4) mammogram done yesterday and normal    History Kellie Roberson has a past medical history of Cancer ([place]) (09/22/2018) and Thyroid disease.   She has a past surgical history that includes Abdominal hysterectomy (2003) and Thyroidectomy (1975).   Her family history includes Alzheimer's disease in her mother;  Glaucoma in her father; Hypertension in her father; Mitral valve prolapse in her sister; Thyroid disease in her sister.She reports that she has never smoked. She has never used smokeless tobacco. She reports current alcohol use. She  reports that she does not use drugs.  Outpatient Medications Prior to Visit  Medication Sig Dispense Refill  . Ascorbic Acid (VITAMIN C) 1000 MG tablet Take 1,000 mg by mouth daily.    Marland Kitchen CLIMARA 0.025 MG/24HR patch NAME BRAND ONLY.  PATIENT WILL PAY OUT OF POCKET (Patient taking differently: Place 0.025 mg onto the skin. APPLY BIWEEKLY. NAME BRAND ONLY.  PATIENT WILL PAY OUT OF POCKET) 12 patch 3  . meloxicam (MOBIC) 15 MG tablet TAKE 1 TABLET BY MOUTH EVERY DAY (Patient taking differently: Take 15 mg by mouth daily as needed. ) 90 tablet 1  . metoprolol succinate (TOPROL XL) 25 MG 24 hr tablet Take 1 tablet (25 mg total) by mouth daily. 30 tablet 6  . Multiple Vitamin (MULTIVITAMIN WITH MINERALS) TABS tablet Take 1 tablet by mouth daily.    . Omega-3 Fatty Acids (FISH OIL) 1000 MG CAPS Take 1,000 mg by mouth daily.    . Probiotic Product (PROBIOTIC-10 PO) Take by mouth daily.    Marland Kitchen SYNTHROID 100 MCG tablet TAKE 1 TABLET (100 MCG TOTAL) BY MOUTH DAILY. NAME BRAND ONLY 90 tablet 1  . Ascorbic Acid (VITAMIN C) 100 MG tablet Take 100 mg by mouth daily as needed. (Patient not taking: Reported on 10/28/2019)     No facility-administered medications prior to visit.    Review of Systems   Patient denies headache, fevers, malaise, unintentional weight loss, skin rash, eye pain, sinus congestion and sinus pain, sore throat, dysphagia,  hemoptysis , cough, dyspnea, wheezing, chest pain, palpitations, orthopnea, edema, abdominal pain, nausea, melena, diarrhea, constipation, flank pain, dysuria, hematuria, urinary  Frequency, nocturia, numbness, tingling, seizures,  Focal weakness, Loss of consciousness,  Tremor, insomnia, depression, anxiety, and suicidal ideation.     Objective:  BP (!) 144/88 (BP Location: Left Arm, Patient Position: Sitting, Cuff Size: Normal)   Pulse 60   Temp 98.1 F (36.7 C) (Oral)   Resp 14   Ht 5\' 6"  (1.676 m)   Wt 152 lb 12.8 oz (69.3 kg)   SpO2 99%   BMI 24.66 kg/m    Physical Exam  General appearance: alert, cooperative and appears stated age Ears: normal TM's and external ear canals both ears Throat: lips, mucosa, and tongue normal; teeth and gums normal Neck: no adenopathy, no carotid bruit, supple, symmetrical, trachea midline and thyroid not enlarged, symmetric, no tenderness/mass/nodules Back: symmetric, no curvature. ROM normal. No CVA tenderness. Lungs: clear to auscultation bilaterally Heart: regular rate and rhythm, S1, S2 normal, no murmur, click, rub or gallop Abdomen: soft, non-tender; bowel sounds normal; no masses,  no organomegaly Pulses: 2+ and symmetric Skin:  Tanned  texture, turgor normal. No rashes or lesions Lymph nodes: Cervical, supraclavicular, and axillary nodes normal.  Assessment & Plan:   Problem List Items Addressed This Visit      Unprioritized   Colon cancer screening - Primary    She is overdue by 2 years.  Cologuard ordered       Relevant Orders   Cologuard   Coronary atherosclerosis due to calcified coronary lesion (CODE)    Mild, 0-24% by coronary calcium CT done by cardiology. .  Statin recommended but thus far deferred per patient choice.       Encounter for preventive health examination  age appropriate education and counseling updated, referrals for preventative services and immunizations addressed, dietary and smoking counseling addressed, most recent labs reviewed.  I have personally reviewed and have noted:  1) the patient's medical and social history 2) The pt's use of alcohol, tobacco, and illicit drugs 3) The patient's current medications and supplements 4) Functional ability including ADL's, fall risk, home safety risk, hearing and visual impairment 5) Diet and physical activities 6) Evidence for depression or mood disorder 7) The patient's height, weight, and BMI have been recorded in the chart  I have made referrals, and provided counseling and education based on review of the above       Postoperative hypothyroidism    She will return for TSH after suspending biotin supplements/       Relevant Orders   TSH   PVC (premature ventricular contraction)    Diagnosed by cardiology.  Controlled with toprol XL/  ECHO normal.       Relevant Orders   Comprehensive metabolic panel   Screening for osteoporosis    Prior DEXA scan from August 2017 has been located In chart (under media) .  She has normal T scores.      White coat syndrome with high blood pressure but without hypertension    Home readings have been normal.       Relevant Orders   Microalbumin / creatinine urine ratio      I am having Kellie Roberson start on Zoster Vaccine Adjuvanted. I am also having her maintain her Probiotic Product (PROBIOTIC-10 PO), Climara, multivitamin with minerals, Fish Oil, meloxicam, Synthroid, metoprolol succinate, and vitamin C.  Meds ordered this encounter  Medications  . Zoster Vaccine Adjuvanted [facility]) injection    Sig: Inject 0.5 mLs into the muscle once for 1 dose.    Dispense:  1 each    Refill:  1    Medications Discontinued During This Encounter  Medication Reason  . Ascorbic Acid (VITAMIN C) 100 MG tablet     Follow-up: No follow-ups on file.   [name], MD

## 2019-10-28 NOTE — Assessment & Plan Note (Addendum)
Prior DEXA scan from August 2017 has been located In chart (under media) .  She has normal T scores.

## 2019-10-28 NOTE — Assessment & Plan Note (Signed)
Home readings have been normal.

## 2019-10-28 NOTE — Assessment & Plan Note (Signed)
She is overdue by 2 years.  Cologuard ordered

## 2019-10-28 NOTE — Assessment & Plan Note (Signed)
Mild, 0-24% by coronary calcium CT done by cardiology. .  Statin recommended but thus far deferred per patient choice.

## 2019-10-28 NOTE — Patient Instructions (Addendum)
voltaren gel for thumb joint/arthritis pain   I do recommend statin therapy since your Coronary CT did note minimal placque  Return for non fasting labs next week and suspend biotin for 72 hours minimum prior to test  12000 mg calcium needed daily'  Cologuard ordered [facility]!!!!   The ShingRx vaccine is now available in local pharmacies and is much more protective than the old one  Zostavax  (it is about 97%  Effective in preventing shingles). .   It is therefore ADVISED for all interested adults over 50 to prevent shingles so I have printed you a prescription for it.  (it requires a 2nd dose 2 too 6 months after the first one) .  It will cause you to have flu  like symptoms for 2 days   Health Maintenance After Age 62 After age 93, you are at a higher risk for certain long-term diseases and infections as well as injuries from falls. Falls are a major cause of broken bones and head injuries in people who are older than age 31. Getting regular preventive care can help to keep you healthy and well. Preventive care includes getting regular testing and making lifestyle changes as recommended by your health care provider. Talk with your health care provider about:  Which screenings and tests you should have. A screening is a test that checks for a disease when you have no symptoms.  A diet and exercise plan that is right for you. What should I know about screenings and tests to prevent falls? Screening and testing are the best ways to find a health problem early. Early diagnosis and treatment give you the best chance of managing medical conditions that are common after age 36. Certain conditions and lifestyle choices may make you more likely to have a fall. Your health care provider may recommend:  Regular vision checks. Poor vision and conditions such as cataracts can make you more likely to have a fall. If you wear glasses, make sure to get your prescription updated if your vision  changes.  Medicine review. Work with your health care provider to regularly review all of the medicines you are taking, including over-the-counter medicines. Ask your health care provider about any side effects that may make you more likely to have a fall. Tell your health care provider if any medicines that you take make you feel dizzy or sleepy.  Osteoporosis screening. Osteoporosis is a condition that causes the bones to get weaker. This can make the bones weak and cause them to break more easily.  Blood pressure screening. Blood pressure changes and medicines to control blood pressure can make you feel dizzy.  Strength and balance checks. Your health care provider may recommend certain tests to check your strength and balance while standing, walking, or changing positions.  Foot health exam. Foot pain and numbness, as well as not wearing proper footwear, can make you more likely to have a fall.  Depression screening. You may be more likely to have a fall if you have a fear of falling, feel emotionally low, or feel unable to do activities that you used to do.  Alcohol use screening. Using too much alcohol can affect your balance and may make you more likely to have a fall. What actions can I take to lower my risk of falls? General instructions  Talk with your health care provider about your risks for falling. Tell your health care provider if: ? You fall. Be sure to tell your health care provider  about all falls, even ones that seem minor. ? You feel dizzy, sleepy, or off-balance.  Take over-the-counter and prescription medicines only as told by your health care provider. These include any supplements.  Eat a healthy diet and maintain a healthy weight. A healthy diet includes low-fat dairy products, low-fat (lean) meats, and fiber from whole grains, beans, and lots of fruits and vegetables. Home safety  Remove any tripping hazards, such as rugs, cords, and clutter.  Install safety  equipment such as grab bars in bathrooms and safety rails on stairs.  Keep rooms and walkways well-lit. Activity   Follow a regular exercise program to stay fit. This will help you maintain your balance. Ask your health care provider what types of exercise are appropriate for you.  If you need a cane or walker, use it as recommended by your health care provider.  Wear supportive shoes that have nonskid soles. Lifestyle  Do not drink alcohol if your health care provider tells you not to drink.  If you drink alcohol, limit how much you have: ? 0-1 drink a day for women. ? 0-2 drinks a day for men.  Be aware of how much alcohol is in your drink. In the [place] one drink equals one typical bottle of beer (12 oz), one-half glass of wine (5 oz), or one shot of hard liquor (1 oz).  Do not use any products that contain nicotine or tobacco, such as cigarettes and e-cigarettes. If you need help quitting, ask your health care provider. Summary  Having a healthy lifestyle and getting preventive care can help to protect your health and wellness after age 57.  Screening and testing are the best way to find a health problem early and help you avoid having a fall. Early diagnosis and treatment give you the best chance for managing medical conditions that are more common for people who are older than age 27.  Falls are a major cause of broken bones and head injuries in people who are older than age 82. Take precautions to prevent a fall at home.  Work with your health care provider to learn what changes you can make to improve your health and wellness and to prevent falls. This information is not intended to replace advice given to you by your health care provider. Make sure you discuss any questions you have with your health care provider. Document Revised: 06/12/2018 Document Reviewed: 01/02/2017 Elsevier Patient Education  2020 [employer].

## 2019-10-28 NOTE — Assessment & Plan Note (Signed)
Diagnosed by cardiology.  Controlled with toprol XL/  ECHO normal.

## 2019-10-28 NOTE — Assessment & Plan Note (Signed)
She will return for TSH after suspending biotin supplements/

## 2019-10-30 DIAGNOSIS — H353131 Nonexudative age-related macular degeneration, bilateral, early dry stage: Secondary | ICD-10-CM | POA: Diagnosis not present

## 2019-11-02 ENCOUNTER — Other Ambulatory Visit: Payer: Self-pay

## 2019-11-02 ENCOUNTER — Telehealth: Payer: Self-pay | Admitting: Internal Medicine

## 2019-11-02 ENCOUNTER — Other Ambulatory Visit (INDEPENDENT_AMBULATORY_CARE_PROVIDER_SITE_OTHER): Payer: Medicare HMO

## 2019-11-02 DIAGNOSIS — R030 Elevated blood-pressure reading, without diagnosis of hypertension: Secondary | ICD-10-CM

## 2019-11-02 DIAGNOSIS — R350 Frequency of micturition: Secondary | ICD-10-CM

## 2019-11-02 DIAGNOSIS — I493 Ventricular premature depolarization: Secondary | ICD-10-CM | POA: Diagnosis not present

## 2019-11-02 DIAGNOSIS — E890 Postprocedural hypothyroidism: Secondary | ICD-10-CM | POA: Diagnosis not present

## 2019-11-02 LAB — URINALYSIS, ROUTINE W REFLEX MICROSCOPIC
Bilirubin Urine: NEGATIVE
Ketones, ur: NEGATIVE
Nitrite: NEGATIVE
Specific Gravity, Urine: <=1.005 — AB (ref 1.000–1.030)
Total Protein, Urine: NEGATIVE
Urine Glucose: NEGATIVE
Urobilinogen, UA: 0.2 (ref 0.0–1.0)
pH: 6 (ref 5.0–8.0)

## 2019-11-02 LAB — COMPREHENSIVE METABOLIC PANEL
ALT: 21 U/L (ref 0–35)
AST: 20 U/L (ref 0–37)
Albumin: 4.2 g/dL (ref 3.5–5.2)
Alkaline Phosphatase: 79 U/L (ref 39–117)
BUN: 15 mg/dL (ref 6–23)
CO2: 26 mEq/L (ref 19–32)
Calcium: 9.1 mg/dL (ref 8.4–10.5)
Chloride: 104 mEq/L (ref 96–112)
Creatinine, Ser: 0.6 mg/dL (ref 0.40–1.20)
GFR: 98.18 mL/min (ref >60.00)
Glucose, Bld: 76 mg/dL (ref 70–99)
Potassium: 3.9 mEq/L (ref 3.5–5.1)
Sodium: 140 mEq/L (ref 135–145)
Total Bilirubin: 1.1 mg/dL (ref 0.2–1.2)
Total Protein: 6.8 g/dL (ref 6.0–8.3)

## 2019-11-02 LAB — MICROALBUMIN / CREATININE URINE RATIO
Creatinine,U: 17.5 mg/dL
Microalb Creat Ratio: 4 mg/g (ref 0.0–30.0)
Microalb, Ur: <0.7 mg/dL (ref 0.0–1.9)

## 2019-11-02 LAB — TSH: TSH: 0.75 u[IU]/mL (ref 0.35–4.50)

## 2019-11-02 NOTE — Telephone Encounter (Signed)
Pt came in this morning for lab work and told the lab that she thinks she has a UTI. Pt collected a urine for a microalbumin. Is it okay to add on a UA w/micro and a culture? If so would you like for me to schedule her for a virtual visit later this week?

## 2019-11-02 NOTE — Telephone Encounter (Signed)
Spoke with pt and scheduled her for a virtual visit on Wednesday.

## 2019-11-02 NOTE — Telephone Encounter (Signed)
Patient had a lab appointment today and she had symptoms of UTI so lab took a urine sample and she was told to let Dr.Tullo know about it

## 2019-11-02 NOTE — Telephone Encounter (Signed)
Yes,  I will add the labs as future  and you can schedule her for wed or Thursday virtual or telephone

## 2019-11-02 NOTE — Addendum Note (Signed)
Addended by: Leeanne Rio on: 11/02/2019 02:28 PM   Modules accepted: Orders

## 2019-11-04 ENCOUNTER — Encounter: Payer: Self-pay | Admitting: Internal Medicine

## 2019-11-04 ENCOUNTER — Telehealth (INDEPENDENT_AMBULATORY_CARE_PROVIDER_SITE_OTHER): Payer: Medicare HMO | Admitting: Internal Medicine

## 2019-11-04 DIAGNOSIS — R030 Elevated blood-pressure reading, without diagnosis of hypertension: Secondary | ICD-10-CM

## 2019-11-04 DIAGNOSIS — E890 Postprocedural hypothyroidism: Secondary | ICD-10-CM | POA: Diagnosis not present

## 2019-11-04 DIAGNOSIS — N3090 Cystitis, unspecified without hematuria: Secondary | ICD-10-CM | POA: Diagnosis not present

## 2019-11-04 NOTE — Assessment & Plan Note (Signed)
Awaiting urine culture for speciation and sensitivities before prescribing abx since symptoms are mild . [facility] [place]

## 2019-11-04 NOTE — Assessment & Plan Note (Signed)
Thyroid function is WNL on current dose.  No current changes needed.   Lab Results  Component Value Date   TSH 0.75 11/02/2019

## 2019-11-04 NOTE — Assessment & Plan Note (Signed)
Screen for proteinuria was negative.  Renal function normal as well

## 2019-11-04 NOTE — Progress Notes (Signed)
Virtual Visit via [place]  This visit type was conducted due to national recommendations for restrictions regarding the COVID-19 pandemic (e.g. social distancing).  This format is felt to be most appropriate for this patient at this time.  All issues noted in this document were discussed and addressed.  No physical exam was performed (except for noted visual exam findings with Video Visits).   I connected with@ on 11/04/19 at  1:15 PM EDT by a video enabled telemedicine application  and verified that I am speaking with the correct person using two identifiers. Location patient: home Location provider: work or home office Persons participating in the virtual visit: patient, provider  I discussed the limitations, risks, security and privacy concerns of performing an evaluation and management service by telephone and the availability of in person appointments. I also discussed with the patient that there may be a patient responsible charge related to this service. The patient expressed understanding and agreed to proceed.  Reason for visit: follow up on labs and abnormal UA  HPI: 74 yr old female presents for follow up on abnormal UA added to patient's labs on day of lab appt for symptoms of UTI.  Burning and frequency.  No nausea,  Back pain or chills.  Has been increasing her water intake for the last several days and symptoms are now intermittent. Risk factors:  No recent UTI,  Swims daily  In public pools and fresh water.  Labs reviewed:  No proteinuria. Normal thyroid function.  Normal CMET     ROS: See pertinent positives and negatives per HPI.  Past Medical History:  Diagnosis Date  . Cancer ([place]) 09/22/2018   skin ca-basal cell  . Thyroid disease     Past Surgical History:  Procedure Laterality Date  . ABDOMINAL HYSTERECTOMY  2003   total-had some structural damage after 2nd pregnancy  . THYROIDECTOMY  1975   was hyperthyroid    Family History  Problem Relation Age of  Onset  . Alzheimer's disease Mother   . Hypertension Father   . Glaucoma Father   . Thyroid disease Sister   . Mitral valve prolapse Sister   . Breast cancer Neg Hx     SOCIAL HX:  reports that she has never smoked. She has never used smokeless tobacco. She reports current alcohol use. She reports that she does not use drugs.   Current Outpatient Medications:  .  Ascorbic Acid (VITAMIN C) 1000 MG tablet, Take 1,000 mg by mouth daily., Disp: , Rfl:  .  CLIMARA 0.025 MG/24HR patch, NAME BRAND ONLY.  PATIENT WILL PAY OUT OF POCKET (Patient taking differently: Place 0.025 mg onto the skin. APPLY BIWEEKLY. NAME BRAND ONLY.  PATIENT WILL PAY OUT OF POCKET), Disp: 12 patch, Rfl: 3 .  meloxicam (MOBIC) 15 MG tablet, TAKE 1 TABLET BY MOUTH EVERY DAY (Patient taking differently: Take 15 mg by mouth daily as needed. ), Disp: 90 tablet, Rfl: 1 .  metoprolol succinate (TOPROL XL) 25 MG 24 hr tablet, Take 1 tablet (25 mg total) by mouth daily., Disp: 30 tablet, Rfl: 6 .  Multiple Vitamin (MULTIVITAMIN WITH MINERALS) TABS tablet, Take 1 tablet by mouth daily., Disp: , Rfl:  .  Omega-3 Fatty Acids (FISH OIL) 1000 MG CAPS, Take 1,000 mg by mouth daily., Disp: , Rfl:  .  Probiotic Product (PROBIOTIC-10 PO), Take by mouth daily., Disp: , Rfl:  .  SYNTHROID 100 MCG tablet, TAKE 1 TABLET (100 MCG TOTAL) BY MOUTH DAILY. NAME BRAND ONLY,  Disp: 90 tablet, Rfl: 1  EXAM:  VITALS per patient if applicable:  GENERAL: alert, oriented, appears well and in no acute distress  HEENT: atraumatic, conjunttiva clear, no obvious abnormalities on inspection of external nose and ears  NECK: normal movements of the head and neck  LUNGS: on inspection no signs of respiratory distress, breathing rate appears normal, no obvious gross SOB, gasping or wheezing  CV: no obvious cyanosis  MS: moves all visible extremities without noticeable abnormality  PSYCH/NEURO: pleasant and cooperative, no obvious depression or anxiety,  speech and thought processing grossly intact  ASSESSMENT AND PLAN:  Discussed the following assessment and plan:  Cystitis  Postoperative hypothyroidism  White coat syndrome with high blood pressure but without hypertension  Cystitis Awaiting urine culture for speciation and sensitivities before prescribing abx since symptoms are mild . [facility] [place]   Postoperative hypothyroidism Thyroid function is WNL on current dose.  No current changes needed.   Lab Results  Component Value Date   TSH 0.75 11/02/2019     White coat syndrome with high blood pressure but without hypertension Screen for proteinuria was negative.  Renal function normal as well     I discussed the assessment and treatment plan with the patient. The patient was provided an opportunity to ask questions and all were answered. The patient agreed with the plan and demonstrated an understanding of the instructions.   The patient was advised to call back or seek an in-person evaluation if the symptoms worsen or if the condition fails to improve as anticipated.  I provided 20 minutes of non-face-to-face time during this encounter.   [name], MD

## 2019-11-05 ENCOUNTER — Other Ambulatory Visit: Payer: Self-pay | Admitting: Internal Medicine

## 2019-11-05 LAB — URINE CULTURE
MICRO NUMBER:: 10888171
SPECIMEN QUALITY:: ADEQUATE

## 2019-11-05 MED ORDER — CIPROFLOXACIN HCL 250 MG PO TABS: 250.0000 mg | ORAL_TABLET | Freq: Two times a day (BID) | ORAL | 0 refills | Status: DC

## 2019-11-20 ENCOUNTER — Other Ambulatory Visit: Payer: Self-pay | Admitting: Internal Medicine

## 2019-11-25 DIAGNOSIS — R69 Illness, unspecified: Secondary | ICD-10-CM | POA: Diagnosis not present

## 2019-11-30 DIAGNOSIS — Z1211 Encounter for screening for malignant neoplasm of colon: Secondary | ICD-10-CM | POA: Diagnosis not present

## 2019-11-30 LAB — COLOGUARD: Cologuard: NEGATIVE

## 2019-12-02 LAB — EXTERNAL GENERIC LAB PROCEDURE: COLOGUARD: NEGATIVE

## 2019-12-02 LAB — COLOGUARD: COLOGUARD: NEGATIVE

## 2019-12-07 ENCOUNTER — Telehealth: Payer: Self-pay | Admitting: Internal Medicine

## 2019-12-07 ENCOUNTER — Encounter: Payer: Self-pay | Admitting: Internal Medicine

## 2019-12-07 DIAGNOSIS — R300 Dysuria: Secondary | ICD-10-CM

## 2019-12-07 NOTE — Telephone Encounter (Signed)
My Chart message sent

## 2019-12-09 ENCOUNTER — Other Ambulatory Visit (INDEPENDENT_AMBULATORY_CARE_PROVIDER_SITE_OTHER): Payer: Medicare HMO

## 2019-12-09 ENCOUNTER — Other Ambulatory Visit: Payer: Self-pay

## 2019-12-09 DIAGNOSIS — R300 Dysuria: Secondary | ICD-10-CM

## 2019-12-09 LAB — URINALYSIS, ROUTINE W REFLEX MICROSCOPIC
Bilirubin Urine: NEGATIVE
Hgb urine dipstick: NEGATIVE
Ketones, ur: NEGATIVE
Leukocytes,Ua: NEGATIVE
Nitrite: NEGATIVE
Specific Gravity, Urine: 1.015 (ref 1.000–1.030)
Total Protein, Urine: NEGATIVE
Urine Glucose: NEGATIVE
Urobilinogen, UA: 0.2 (ref 0.0–1.0)
pH: 7.5 (ref 5.0–8.0)

## 2019-12-09 NOTE — Telephone Encounter (Signed)
Labs have been ordered

## 2019-12-09 NOTE — Telephone Encounter (Signed)
Please put orders in for urine. Pt will be passing by our office in about an hour. Please advise

## 2019-12-09 NOTE — Addendum Note (Signed)
Addended by: Adair Laundry on: 12/09/2019 08:35 AM   Modules accepted: Orders

## 2019-12-12 ENCOUNTER — Other Ambulatory Visit: Payer: Self-pay | Admitting: Internal Medicine

## 2019-12-12 LAB — URINE CULTURE
MICRO NUMBER:: 11044234
SPECIMEN QUALITY:: ADEQUATE

## 2019-12-12 MED ORDER — SULFAMETHOXAZOLE-TRIMETHOPRIM 800-160 MG PO TABS: 1.0000 | ORAL_TABLET | Freq: Two times a day (BID) | ORAL | 0 refills | Status: DC

## 2019-12-12 NOTE — Progress Notes (Signed)
You do  appear to have another  UTI,  different organism than the previous one.   I have prescribed Septra DS to take 2 times daily  for  5 days  Please take a probiotic ( Align, Floraque or Culturelle) for 2 weeks if you start the antibiotic to prevent a serious antibiotic associated diarrhea  Called" clostridium dificile colitis" ( should also help prevent   vaginal yeast infection) .  Regards,   Deborra Medina, MD

## 2020-01-06 ENCOUNTER — Ambulatory Visit (INDEPENDENT_AMBULATORY_CARE_PROVIDER_SITE_OTHER): Payer: Medicare HMO | Admitting: Internal Medicine

## 2020-01-06 ENCOUNTER — Encounter: Payer: Self-pay | Admitting: Internal Medicine

## 2020-01-06 ENCOUNTER — Other Ambulatory Visit: Payer: Self-pay

## 2020-01-06 VITALS — BP 142/88 | HR 66 | Temp 97.4°F | Resp 15 | Ht 66.0 in | Wt 155.4 lb

## 2020-01-06 DIAGNOSIS — N3090 Cystitis, unspecified without hematuria: Secondary | ICD-10-CM

## 2020-01-06 DIAGNOSIS — N8111 Cystocele, midline: Secondary | ICD-10-CM | POA: Insufficient documentation

## 2020-01-06 DIAGNOSIS — R300 Dysuria: Secondary | ICD-10-CM | POA: Diagnosis not present

## 2020-01-06 LAB — URINALYSIS, ROUTINE W REFLEX MICROSCOPIC
Bilirubin Urine: NEGATIVE
Hgb urine dipstick: NEGATIVE
Ketones, ur: NEGATIVE
Leukocytes,Ua: NEGATIVE
Nitrite: NEGATIVE
RBC / HPF: NONE SEEN (ref 0–?)
Specific Gravity, Urine: <=1.005 — AB (ref 1.000–1.030)
Total Protein, Urine: NEGATIVE
Urine Glucose: NEGATIVE
Urobilinogen, UA: 0.2 (ref 0.0–1.0)
WBC, UA: NONE SEEN (ref 0–?)
pH: 6.5 (ref 5.0–8.0)

## 2020-01-06 NOTE — Assessment & Plan Note (Signed)
Recurrent , with two UTIs in the last 3 months .  Altered anatomy may be the cause ,  As she has developed a cystocele.  Repeat UA and culture are pending

## 2020-01-06 NOTE — Progress Notes (Signed)
Subjective:  Patient ID: Kellie Roberson, female    DOB: 30-Aug-1945  Age: 74 y.o. MRN: [id number]  CC: The primary encounter diagnosis was Dysuria. Diagnoses of Pelvic relaxation due to cystocele, midline and Cystitis were also pertinent to this visit.  HPI Kellie Roberson presents for ongoing suprapubic discomfort and dysuria accompanied by perineal bulge.  This visit occurred during the SARS-CoV-2 public health emergency.  Safety protocols were in place, including screening questions prior to the visit, additional usage of staff PPE, and extensive cleaning of exam room while observing appropriate contact time as indicated for disinfecting solutions.    Patient has received both doses of the available COVID 19 vaccine without complications.  Patient continues to mask when outside of the home except when walking in yard or at safe distances from others .  Patient denies any change in mood or development of unhealthy behaviors resuting from the pandemic's restriction of activities and socialization.    HS:  74 yr old female with hysterectomy secondary to uterine prolapse  Followed by pelvic physical therapy and Kegel exercises presents with several month history of pelvic discomfort aggravated bending over and picking up heavy objectives and a bulging feeling at the introitus.     She has been treated twice in the last 3 months for UTI , initially for E Coli,  Followed by Staph Saprophyticus.  Symptoms of lower abdominal discomfort have not resolved.   Outpatient Medications Prior to Visit  Medication Sig Dispense Refill  . Ascorbic Acid (VITAMIN C) 1000 MG tablet Take 1,000 mg by mouth daily.    Marland Kitchen estradiol (CLIMARA - DOSED IN MG/24 HR) 0.025 mg/24hr patch USE AS DIRECTED 4 patch 11  . meloxicam (MOBIC) 15 MG tablet TAKE 1 TABLET BY MOUTH EVERY DAY (Patient taking differently: Take 15 mg by mouth daily as needed. ) 90 tablet 1  . metoprolol succinate (TOPROL XL) 25 MG 24 hr tablet Take 1 tablet  (25 mg total) by mouth daily. 30 tablet 6  . Multiple Vitamin (MULTIVITAMIN WITH MINERALS) TABS tablet Take 1 tablet by mouth daily.    . Omega-3 Fatty Acids (FISH OIL) 1000 MG CAPS Take 1,000 mg by mouth daily.    . Probiotic Product (PROBIOTIC-10 PO) Take by mouth daily.    Marland Kitchen SYNTHROID 100 MCG tablet TAKE 1 TABLET (100 MCG TOTAL) BY MOUTH DAILY. NAME BRAND ONLY 90 tablet 1  . ciprofloxacin (CIPRO) 250 MG tablet Take 1 tablet (250 mg total) by mouth 2 (two) times daily. (Patient not taking: Reported on 01/06/2020) 6 tablet 0  . sulfamethoxazole-trimethoprim (BACTRIM DS) 800-160 MG tablet Take 1 tablet by mouth 2 (two) times daily. (Patient not taking: Reported on 01/06/2020) 10 tablet 0   No facility-administered medications prior to visit.    Review of Systems;  Patient denies headache, fevers, malaise, unintentional weight loss, skin rash, eye pain, sinus congestion and sinus pain, sore throat, dysphagia,  hemoptysis , cough, dyspnea, wheezing, chest pain, palpitations, orthopnea, edema, abdominal pain, nausea, melena, diarrhea, constipation, flank pain, dysuria, hematuria, urinary  Frequency, nocturia, numbness, tingling, seizures,  Focal weakness, Loss of consciousness,  Tremor, insomnia, depression, anxiety, and suicidal ideation.      Objective:  BP (!) 142/88 (BP Location: Left Arm, Patient Position: Sitting, Cuff Size: Normal)   Pulse 66   Temp (!) 97.4 F (36.3 C) (Oral)   Resp 15   Ht 5\' 6"  (1.676 m)   Wt 155 lb 6.4 oz (70.5 kg)   SpO2  99%   BMI 25.08 kg/m   BP Readings from Last 3 Encounters:  01/06/20 (!) 142/88  10/28/19 (!) 144/88  07/20/19 130/80    Wt Readings from Last 3 Encounters:  01/06/20 155 lb 6.4 oz (70.5 kg)  11/04/19 152 lb 12.8 oz (69.3 kg)  10/28/19 152 lb 12.8 oz (69.3 kg)    General appearance: alert, cooperative and appears stated age Ears: normal TM's and external ear canals both ears Throat: lips, mucosa, and tongue normal; teeth and gums  normal Neck: no adenopathy, no carotid bruit, supple, symmetrical, trachea midline and thyroid not enlarged, symmetric, no tenderness/mass/nodules Back: symmetric, no curvature. ROM normal. No CVA tenderness. Lungs: clear to auscultation bilaterally Heart: regular rate and rhythm, S1, S2 normal, no murmur, click, rub or gallop Abdomen: soft, non-tender; bowel sounds normal; no masses,  no organomegaly Pulses: 2+ and symmetric Skin: Skin color, texture, turgor normal. No rashes or lesions Lymph nodes: Cervical, supraclavicular, and axillary nodes normal.  No results found for: HGBA1C  Lab Results  Component Value Date   CREATININE 0.60 11/02/2019   CREATININE 0.63 12/20/2018   CREATININE 0.58 07/22/2018    Lab Results  Component Value Date   WBC 4.4 12/20/2018   HGB 14.4 12/20/2018   HCT 42.3 12/20/2018   PLT 192 12/20/2018   GLUCOSE 76 11/02/2019   CHOL 213 (H) 10/08/2019   TRIG 77 10/08/2019   HDL 75 10/08/2019   LDLCALC 123 (H) 10/08/2019   ALT 21 11/02/2019   AST 20 11/02/2019   NA 140 11/02/2019   K 3.9 11/02/2019   CL 104 11/02/2019   CREATININE 0.60 11/02/2019   BUN 15 11/02/2019   CO2 26 11/02/2019   TSH 0.75 11/02/2019   MICROALBUR <0.7 11/02/2019    MM 3D SCREEN BREAST BILATERAL  Result Date: 10/28/2019 CLINICAL DATA:  Screening. EXAM: DIGITAL SCREENING BILATERAL MAMMOGRAM WITH TOMO AND CAD COMPARISON:  Previous exam(s). ACR Breast Density Category b: There are scattered areas of fibroglandular density. FINDINGS: There are no findings suspicious for malignancy. Images were processed with CAD. IMPRESSION: No mammographic evidence of malignancy. A result letter of this screening mammogram will be mailed directly to the patient. RECOMMENDATION: Screening mammogram in one year. (Code:SM-B-01Y) BI-RADS CATEGORY  1: Negative. Electronically Signed   By: Nolon Nations M.D.   On: 10/28/2019 08:50    Assessment & Plan:   Problem List Items Addressed This Visit       Unprioritized   Cystitis    Recurrent , with two UTIs in the last 3 months .  Altered anatomy may be the cause ,  As she has developed a cystocele.  Repeat UA and culture are pending       Pelvic relaxation due to cystocele, midline    Risk factors include history of hysterectomy due to uterine prolapse .  Referral to Blima Rich, MD at [facility]       Relevant Orders   Ambulatory referral to Urogynecology    Other Visit Diagnoses    Dysuria    -  Primary   Relevant Orders   Urinalysis, Routine w reflex microscopic (Completed)   Urine Culture       I provided  30 minutes of  face-to-face time during this encounter reviewing patient's current problems and past surgeries, labs and imaging studies, providing counseling on the above mentioned problems , and coordination  of care .  I have discontinued Kellie Roberson's ciprofloxacin and sulfamethoxazole-trimethoprim. I am also having her  maintain her Probiotic Product (PROBIOTIC-10 PO), multivitamin with minerals, Fish Oil, meloxicam, Synthroid, metoprolol succinate, vitamin C, and estradiol.  No orders of the defined types were placed in this encounter.   Medications Discontinued During This Encounter  Medication Reason  . ciprofloxacin (CIPRO) 250 MG tablet Completed Course  . sulfamethoxazole-trimethoprim (BACTRIM DS) 800-160 MG tablet Completed Course    Follow-up: No follow-ups on file.   [name], MD

## 2020-01-06 NOTE — Assessment & Plan Note (Signed)
Risk factors include history of hysterectomy due to uterine prolapse .  Referral to Blima Rich, MD at [facility]

## 2020-01-07 NOTE — Progress Notes (Signed)
Thus far, There is no evidence of UTI by urinalysis., and you are definitely drinking enough water!  the culture is still pending  And will take another 24 hours.   Regards,   Deborra Medina, MD

## 2020-01-08 DIAGNOSIS — I471 Supraventricular tachycardia: Secondary | ICD-10-CM | POA: Diagnosis not present

## 2020-01-08 DIAGNOSIS — Z79890 Hormone replacement therapy (postmenopausal): Secondary | ICD-10-CM | POA: Diagnosis not present

## 2020-01-08 DIAGNOSIS — M1990 Unspecified osteoarthritis, unspecified site: Secondary | ICD-10-CM | POA: Diagnosis not present

## 2020-01-08 DIAGNOSIS — G8929 Other chronic pain: Secondary | ICD-10-CM | POA: Diagnosis not present

## 2020-01-08 DIAGNOSIS — E039 Hypothyroidism, unspecified: Secondary | ICD-10-CM | POA: Diagnosis not present

## 2020-01-08 LAB — URINE CULTURE
MICRO NUMBER:: 11154925
Result:: NO GROWTH
SPECIMEN QUALITY:: ADEQUATE

## 2020-01-08 NOTE — Progress Notes (Signed)
There is no evidence of UTI by culture results. The referral to Dr Sharlett Iles is in process.  Regards,   Deborra Medina, MD

## 2020-01-20 ENCOUNTER — Other Ambulatory Visit: Payer: Self-pay | Admitting: Internal Medicine

## 2020-02-01 ENCOUNTER — Ambulatory Visit
Admission: RE | Admit: 2020-02-01 | Discharge: 2020-02-01 | Disposition: A | Discharge status: Home / Self Care | Payer: Medicare HMO | Source: Ambulatory Visit | Attending: Emergency Medicine | Admitting: Emergency Medicine

## 2020-02-01 ENCOUNTER — Other Ambulatory Visit: Payer: Self-pay

## 2020-02-01 VITALS — BP 159/79 | HR 61 | Temp 97.9°F | Resp 18 | Ht 66.0 in | Wt 150.0 lb

## 2020-02-01 DIAGNOSIS — H9192 Unspecified hearing loss, left ear: Secondary | ICD-10-CM | POA: Diagnosis not present

## 2020-02-01 DIAGNOSIS — H6121 Impacted cerumen, right ear: Secondary | ICD-10-CM

## 2020-02-01 DIAGNOSIS — H938X3 Other specified disorders of ear, bilateral: Secondary | ICD-10-CM

## 2020-02-01 MED ORDER — FLUTICASONE PROPIONATE 50 MCG/ACT NA SUSP: 2.0000 | Freq: Every day | NASAL | 0 refills | Status: DC

## 2020-02-01 NOTE — ED Triage Notes (Signed)
Patient complains of left ear pain that started x 4 days ago. States that she has been using debrox without relief.

## 2020-02-01 NOTE — Discharge Instructions (Addendum)
Continue Debrox.  Try some Flonase and Sudafed.  If this does not work, then follow-up with the ENT to evaluate for your hearing loss and ear fullness.

## 2020-02-01 NOTE — ED Provider Notes (Signed)
HPI  SUBJECTIVE:  Kellie Roberson is a 74 y.o. female who presents with 4 days of bilateral ear fullness, discomfort and pressure in her left ear and decreased hearing in her ears in the left more so than the right.  She denies fevers, has very mild nasal congestion.  No other URI symptoms.  No recent swimming.  No recent exposure to loud noises.  No external ear erythema, swelling, no otorrhea.  She has been using Debrox in both of her ears without improvement in her symptoms.  No aggravating factors.  She has a past medical history of frequent otitis externa and cerumen impaction.  No history of otitis media, diabetes, cancer, hypertension.  VQM:GQQPY, Aris Everts, MD   Past Medical History:  Diagnosis Date  . Cancer ([place]) 09/22/2018   skin ca-basal cell  . Thyroid disease     Past Surgical History:  Procedure Laterality Date  . ABDOMINAL HYSTERECTOMY  2003   total-had some structural damage after 2nd pregnancy  . THYROIDECTOMY  1975   was hyperthyroid    Family History  Problem Relation Age of Onset  . Alzheimer's disease Mother   . Hypertension Father   . Glaucoma Father   . Thyroid disease Sister   . Mitral valve prolapse Sister   . Breast cancer Neg Hx     Social History   Tobacco Use  . Smoking status: Never Smoker  . Smokeless tobacco: Never Used  Vaping Use  . Vaping Use: Never used  Substance Use Topics  . Alcohol use: Yes    Comment: social  . Drug use: No    No current facility-administered medications for this encounter.  Current Outpatient Medications:  .  Ascorbic Acid (VITAMIN C) 1000 MG tablet, Take 1,000 mg by mouth daily., Disp: , Rfl:  .  estradiol (CLIMARA - DOSED IN MG/24 HR) 0.025 mg/24hr patch, USE AS DIRECTED, Disp: 4 patch, Rfl: 11 .  meloxicam (MOBIC) 15 MG tablet, TAKE 1 TABLET BY MOUTH EVERY DAY (Patient taking differently: Take 15 mg by mouth daily as needed. ), Disp: 90 tablet, Rfl: 1 .  metoprolol succinate (TOPROL XL) 25 MG 24 hr tablet,  Take 1 tablet (25 mg total) by mouth daily., Disp: 30 tablet, Rfl: 6 .  Multiple Vitamin (MULTIVITAMIN WITH MINERALS) TABS tablet, Take 1 tablet by mouth daily., Disp: , Rfl:  .  Omega-3 Fatty Acids (FISH OIL) 1000 MG CAPS, Take 1,000 mg by mouth daily., Disp: , Rfl:  .  Probiotic Product (PROBIOTIC-10 PO), Take by mouth daily., Disp: , Rfl:  .  SYNTHROID 100 MCG tablet, TAKE 1 TABLET (100 MCG TOTAL) BY MOUTH DAILY. NAME BRAND ONLY, Disp: 90 tablet, Rfl: 1 .  fluticasone (FLONASE) 50 MCG/ACT nasal spray, Place 2 sprays into both nostrils daily., Disp: 16 g, Rfl: 0  No Known Allergies   ROS  As noted in HPI.   Physical Exam  BP (!) 159/79 (BP Location: Left Arm)   Pulse 61   Temp 97.9 F (36.6 C) (Oral)   Resp 18   Ht 5\' 6"  (1.676 m)   Wt 68 kg   SpO2 99%   BMI 24.21 kg/m   Constitutional: Well developed, well nourished, no acute distress Eyes:  EOMI, conjunctiva normal bilaterally HENT: Normocephalic, atraumatic,mucus membranes moist. Bilateral external ears normal. No pain with traction on pinna, palpation of tragus, palpation of mastoid. L EAC normal, clear. Left TM intact, normal. No effusion. Right EAC obscured by cerumen. Hearing intact bilaterally, decreased  on the left. positive mild nasal congestion bilaterally on the left more so than the right. Respiratory: Normal inspiratory effort Cardiovascular: Normal rate GI: nondistended skin: No rash, skin intact Musculoskeletal: no deformities Neurologic: Alert & oriented x 3, no focal neuro deficits Psychiatric: Speech and behavior appropriate   ED Course   Medications - No data to display  Orders Placed This Encounter  Procedures  . Ear wax removal    Right ear only    Standing Status:   Standing    Number of Occurrences:   1    No results found for this or any previous visit (from the past 24 hour(s)). No results found.  ED Clinical Impression  1. Impacted cerumen of right ear   2. Decreased hearing of  left ear   3. Sensation of fullness in both ears      ED Assessment/Plan  Right ear obscured by cerumen. We will have this irrigated and reevaluate.  Left TM normal. No evidence of otitis media, otitis externa.  Doubt TMJ arthralgia because she is describing fullness rather than ear pain.  She may be having the ear fullness and decreased hearing secondary to nasal congestion, so will have her try some Flonase and Sudafed, and if this does not work, she will need to follow-up with Dr. Kathyrn Sheriff at [facility] ear nose throat for evaluation of hearing loss.  On reevaluation, is able to partially visualize the right TM.  It appears normal.  However it is still partially obstructed by cerumen.  Continue Debrox.  Plan as above.  Discussed MDM, treatment plan, and plan for follow-up with patient. patient agrees with plan.   Meds ordered this encounter  Medications  . fluticasone (FLONASE) 50 MCG/ACT nasal spray    Sig: Place 2 sprays into both nostrils daily.    Dispense:  16 g    Refill:  0    *This clinic note was created using [employer]. Therefore, there may be occasional mistakes despite careful proofreading.   ?    Melynda Ripple, MD 02/02/20 1032

## 2020-02-08 DIAGNOSIS — N8111 Cystocele, midline: Secondary | ICD-10-CM | POA: Diagnosis not present

## 2020-03-15 ENCOUNTER — Other Ambulatory Visit: Payer: Self-pay

## 2020-03-15 MED ORDER — METOPROLOL SUCCINATE ER 25 MG PO TB24
25.0000 mg | ORAL_TABLET | Freq: Every day | ORAL | 0 refills | Status: DC
Start: 2020-03-15 — End: 2020-04-20

## 2020-04-04 ENCOUNTER — Telehealth: Payer: Self-pay | Admitting: Cardiology

## 2020-04-04 DIAGNOSIS — I2584 Coronary atherosclerosis due to calcified coronary lesion: Secondary | ICD-10-CM

## 2020-04-04 NOTE — Telephone Encounter (Signed)
Patient calling  [facility] next appointment on 04/11/20 No orders in chart  Please review and place orders  Patient would like to be notified that they are ready through [place] message

## 2020-04-04 NOTE — Telephone Encounter (Signed)
Lipid Panel was ordered and MyChart message sent to patient through [place].

## 2020-04-07 ENCOUNTER — Other Ambulatory Visit
Admission: RE | Admit: 2020-04-07 | Discharge: 2020-04-07 | Disposition: A | Discharge status: Home / Self Care | Payer: Medicare HMO | Attending: Cardiology | Admitting: Cardiology

## 2020-04-07 ENCOUNTER — Other Ambulatory Visit: Payer: Self-pay

## 2020-04-07 DIAGNOSIS — I2510 Atherosclerotic heart disease of native coronary artery without angina pectoris: Secondary | ICD-10-CM | POA: Diagnosis not present

## 2020-04-07 DIAGNOSIS — I2584 Coronary atherosclerosis due to calcified coronary lesion: Secondary | ICD-10-CM | POA: Insufficient documentation

## 2020-04-07 LAB — LIPID PANEL
Cholesterol: 220 mg/dL — ABNORMAL HIGH (ref 0–200)
HDL: 84 mg/dL (ref >40)
LDL Cholesterol: 121 mg/dL — ABNORMAL HIGH (ref 0–99)
Total CHOL/HDL Ratio: 2.6 RATIO
Triglycerides: 73 mg/dL (ref <150)
VLDL: 15 mg/dL (ref 0–40)

## 2020-04-08 ENCOUNTER — Telehealth: Payer: Self-pay

## 2020-04-08 DIAGNOSIS — E7800 Pure hypercholesterolemia, unspecified: Secondary | ICD-10-CM

## 2020-04-08 DIAGNOSIS — E785 Hyperlipidemia, unspecified: Secondary | ICD-10-CM

## 2020-04-08 MED ORDER — ATORVASTATIN CALCIUM 40 MG PO TABS: 40.0000 mg | ORAL_TABLET | Freq: Every day | ORAL | 5 refills | Status: DC

## 2020-04-08 NOTE — Telephone Encounter (Signed)
Spoke with patient  Regarding the following copied and pasted result notes. Patient is willing to try the statin therapy now as recommended. She is requesting to cancel her follow up on Monday 04/11/20 until we can check her lipid panel again to see if the statin is helping. Will route to Dr. Garen Lah for recommendations.   Kate Sable, MD  04/07/2020 4:34 PM EST      Cholesterol values still elevated . Statin therapy is recommended. Patient previously declined statin therapy. Recommend starting Lipitor 40 mg daily if patient is now willing to begin therapy. Thank you

## 2020-04-08 NOTE — Telephone Encounter (Signed)
Left a message for patient to call back for results

## 2020-04-11 ENCOUNTER — Ambulatory Visit: Payer: Medicare HMO | Admitting: Cardiology

## 2020-04-11 NOTE — Telephone Encounter (Signed)
Dr. Thereasa Solo response is copied and pasted below:  Okay to cancel upcoming appointment. Take Lipitor as prescribed, repeat fasting lipid panel in 3 months. Follow-up after repeat lipid panel.   I will send instructions to patient through MyChart as requested by her during previous phone conversation, and route to scheduling for the 3 month follow up.

## 2020-04-20 ENCOUNTER — Other Ambulatory Visit: Payer: Self-pay

## 2020-04-20 MED ORDER — METOPROLOL SUCCINATE ER 25 MG PO TB24
25.0000 mg | ORAL_TABLET | Freq: Every day | ORAL | 2 refills | Status: DC
Start: 2020-04-20 — End: 2020-07-22

## 2020-06-03 ENCOUNTER — Other Ambulatory Visit: Payer: Self-pay | Admitting: Internal Medicine

## 2020-07-07 ENCOUNTER — Other Ambulatory Visit: Payer: Self-pay

## 2020-07-07 DIAGNOSIS — E785 Hyperlipidemia, unspecified: Secondary | ICD-10-CM

## 2020-07-07 NOTE — Progress Notes (Signed)
Put in an order for Medical Nutritional Therapy as requested by patient in MyChart message as seen below:  Hello, Dr Garen Lah - it's been the craziest thing, but I just can't make myself start this new medication. Is it possible for you to refer me to a nutritionist within the [facility] team who could help me with losing ~10#? I do believe I might not need the Atorvastatin if I could accomplish that. Thanks much for your help with this, hope you are well - Kellie Roberson

## 2020-07-11 ENCOUNTER — Telehealth: Payer: Self-pay | Admitting: Cardiology

## 2020-07-11 ENCOUNTER — Ambulatory Visit: Payer: Medicare HMO | Admitting: Cardiology

## 2020-07-11 NOTE — Telephone Encounter (Signed)
Attempted to schedule.  LMOV to call office.  ° °

## 2020-07-11 NOTE — Telephone Encounter (Signed)
-----   Message from Anselm Pancoast, [place] sent at 07/11/2020  1:23 PM EDT ----- Please contact patient for a follow up appointment. The patient is past due and is requesting a refill.  Thanks, Ivin Booty

## 2020-07-14 ENCOUNTER — Other Ambulatory Visit
Admission: RE | Admit: 2020-07-14 | Discharge: 2020-07-14 | Disposition: A | Discharge status: Home / Self Care | Payer: Medicare HMO | Source: Ambulatory Visit | Attending: Cardiology | Admitting: Cardiology

## 2020-07-14 DIAGNOSIS — E785 Hyperlipidemia, unspecified: Secondary | ICD-10-CM | POA: Diagnosis not present

## 2020-07-14 LAB — LIPID PANEL
Cholesterol: 220 mg/dL — ABNORMAL HIGH (ref 0–200)
HDL: 86 mg/dL (ref >40)
LDL Cholesterol: 121 mg/dL — ABNORMAL HIGH (ref 0–99)
Total CHOL/HDL Ratio: 2.6 RATIO
Triglycerides: 65 mg/dL (ref <150)
VLDL: 13 mg/dL (ref 0–40)

## 2020-07-18 ENCOUNTER — Encounter: Payer: Self-pay | Admitting: Cardiology

## 2020-07-18 ENCOUNTER — Other Ambulatory Visit: Payer: Self-pay

## 2020-07-18 ENCOUNTER — Ambulatory Visit: Payer: Medicare HMO | Admitting: Cardiology

## 2020-07-18 VITALS — BP 136/78 | HR 66 | Ht 66.0 in | Wt 154.0 lb

## 2020-07-18 DIAGNOSIS — I471 Supraventricular tachycardia, unspecified: Secondary | ICD-10-CM

## 2020-07-18 DIAGNOSIS — E785 Hyperlipidemia, unspecified: Secondary | ICD-10-CM | POA: Diagnosis not present

## 2020-07-18 NOTE — Progress Notes (Signed)
Cardiology Office Note:    Date:  07/18/2020   ID:  Kellie Roberson, DOB Aug 07, 1945, MRN [id number]  PCP:  [name], MD  Cardiologist:  Kate Sable, MD  Electrophysiologist:  None   Referring MD: [name], MD   Chief Complaint  Patient presents with  . Other    Past due follow up - Meds reviewed verbally with patient     History of Present Illness:    Kellie Roberson is a 75 y.o. female with a hx of hypothyroidism, paroxysmal SVT, hyperlipidemia who presents for follow-up.    Patient being seen for hyperlipidemia, paroxysmal SVTs.  Symptoms of palpitations have improved with Toprol-XL.  Repeat fasting lipid profile was obtained. Patient declined statin therapy previously.  We will like to see nutritional services to help with Dietary management to help reduce cholesterol levels.  She is otherwise okay, has no concerns at this time.  Prior notes Transthoracic echo 12/2018 showed normal systolic and diastolic function, EF 55 to 60%.   Coronary CTA 01/2019 had a calcium score of 44, minimal nonobstructive CAD in the distal left main/ostial LAD 0 to 24%.  Cardiac monitor 01/2019 paroxysmal SVT. Statin therapy recommended, patient declined.  Past Medical History:  Diagnosis Date  . Cancer ([place]) 09/22/2018   skin ca-basal cell  . Thyroid disease     Past Surgical History:  Procedure Laterality Date  . ABDOMINAL HYSTERECTOMY  2003   total-had some structural damage after 2nd pregnancy  . THYROIDECTOMY  1975   was hyperthyroid    Current Medications: Current Meds  Medication Sig  . Ascorbic Acid (VITAMIN C) 1000 MG tablet Take 1,000 mg by mouth daily.  Marland Kitchen estradiol (CLIMARA - DOSED IN MG/24 HR) 0.025 mg/24hr patch USE AS DIRECTED  . meloxicam (MOBIC) 15 MG tablet Take 15 mg by mouth as needed for pain.  . metoprolol succinate (TOPROL XL) 25 MG 24 hr tablet Take 1 tablet (25 mg total) by mouth daily.  . Multiple Vitamin (MULTIVITAMIN WITH MINERALS) TABS tablet  Take 1 tablet by mouth daily.  . Omega-3 Fatty Acids (FISH OIL) 1000 MG CAPS Take 1,000 mg by mouth daily.  . Probiotic Product (PROBIOTIC-10 PO) Take by mouth daily.  Marland Kitchen SYNTHROID 100 MCG tablet TAKE 1 TABLET (100 MCG TOTAL) BY MOUTH DAILY. NAME BRAND ONLY     Allergies:   Patient has no known allergies.   Social History   Socioeconomic History  . Marital status: Married    Spouse name: Not on file  . Number of children: Not on file  . Years of education: Not on file  . Highest education level: Not on file  Occupational History  . Not on file  Tobacco Use  . Smoking status: Never Smoker  . Smokeless tobacco: Never Used  Vaping Use  . Vaping Use: Never used  Substance and Sexual Activity  . Alcohol use: Yes    Comment: social  . Drug use: No  . Sexual activity: Not on file  Other Topics Concern  . Not on file  Social History Narrative  . Not on file   Social Determinants of Health   Financial Resource Strain: Not on file  Food Insecurity: No Food Insecurity  . Worried About Charity fundraiser in the Last Year: Never true  . Ran Out of Food in the Last Year: Never true  Transportation Needs: No Transportation Needs  . Lack of Transportation (Medical): No  . Lack of Transportation (Non-Medical):  No  Physical Activity: Not on file  Stress: No Stress Concern Present  . Feeling of Stress : Not at all  Social Connections: Unknown  . Frequency of Communication with Friends and Family: More than three times a week  . Frequency of Social Gatherings with Friends and Family: More than three times a week  . Attends Religious Services: Not on file  . Active Member of Clubs or Organizations: Not on file  . Attends Archivist Meetings: Not on file  . Marital Status: Married     Family History: The patient's family history includes Alzheimer's disease in her mother; Glaucoma in her father; Hypertension in her father; Mitral valve prolapse in her sister; Thyroid  disease in her sister. There is no history of Breast cancer.  ROS:   Please see the history of present illness.     All other systems reviewed and are negative.  EKGs/Labs/Other Studies Reviewed:    The following studies were reviewed today:   EKG:  EKG is  ordered today.  The ekg ordered today demonstrates normal sinus rhythm  Recent Labs: 11/02/2019: ALT 21; BUN 15; Creatinine, Ser 0.60; Potassium 3.9; Sodium 140; TSH 0.75  Recent Lipid Panel    Component Value Date/Time   CHOL 220 (H) 07/14/2020 0730   CHOL 229 (H) 07/09/2017 0732   TRIG 65 07/14/2020 0730   HDL 86 07/14/2020 0730   HDL 86 07/09/2017 0732   CHOLHDL 2.6 07/14/2020 0730   VLDL 13 07/14/2020 0730   LDLCALC 121 (H) 07/14/2020 0730   LDLCALC 128 (H) 07/09/2017 0732    Physical Exam:    VS:  BP 136/78 (BP Location: Left Arm, Patient Position: Sitting, Cuff Size: Normal)   Pulse 66   Ht 5\' 6"  (1.676 m)   Wt 154 lb (69.9 kg)   SpO2 96%   BMI 24.86 kg/m     Wt Readings from Last 3 Encounters:  07/18/20 154 lb (69.9 kg)  02/01/20 150 lb (68 kg)  01/06/20 155 lb 6.4 oz (70.5 kg)     GEN:  Well nourished, well developed in no acute distress HEENT: Normal NECK: No JVD; No carotid bruits LYMPHATICS: No lymphadenopathy CARDIAC: RRR, no murmurs, rubs, gallops RESPIRATORY:  Clear to auscultation without rales, wheezing or rhonchi  ABDOMEN: Soft, non-tender, non-distended MUSCULOSKELETAL:  No edema; No deformity  SKIN: Warm and dry NEUROLOGIC:  Alert and oriented x 3 PSYCHIATRIC:  Normal affect   ASSESSMENT:     1. Paroxysmal SVT (supraventricular tachycardia) ([place])   2. Hyperlipidemia, unspecified hyperlipidemia type    PLAN:     1.  History of paroxysmal SVT, PVCs, symptoms improved with Toprol-XL.  Continue Toprol-XL.  Previous echo was normal and coronary CTA with no obstructive CAD.  2.  Patient with history of hyperlipidemia, statin therapy previously recommended, patient declined.  Repeat  fasting lipid profile similar to last.  Recommend statin therapy if patient is willing to take.  Continue low-cholesterol diet, has appointment with nutritionist.  Follow-up in 1 year    Medication Adjustments/Labs and Tests Ordered: Current medicines are reviewed at length with the patient today.  Concerns regarding medicines are outlined above.  Orders Placed This Encounter  Procedures  . EKG 12-Lead   No orders of the defined types were placed in this encounter.   Patient Instructions  Medication Instructions:  No changes *If you need a refill on your cardiac medications before your next appointment, please call your pharmacy*   Lab Work: None  ordered If you have labs (blood work) drawn today and your tests are completely normal, you will receive your results only by: Marland Kitchen MyChart Message (if you have MyChart) OR . A paper copy in the mail If you have any lab test that is abnormal or we need to change your treatment, we will call you to review the results.   Testing/Procedures: None ordered   Follow-Up: At [facility], you and your health needs are our priority.  As part of our continuing mission to provide you with exceptional heart care, we have created designated Provider Care Teams.  These Care Teams include your primary Cardiologist (physician) and Advanced Practice Providers (APPs -  Physician Assistants and Nurse Practitioners) who all work together to provide you with the care you need, when you need it.  We recommend signing up for the patient portal called "MyChart".  Sign up information is provided on this After Visit Summary.  MyChart is used to connect with patients for Virtual Visits (Telemedicine).  Patients are able to view lab/test results, encounter notes, upcoming appointments, etc.  Non-urgent messages can be sent to your provider as well.   To learn more about what you can do with MyChart, go to [email or web address].    Your next appointment:   12  month(s)  The format for your next appointment:   In Person  Provider:   Kate Sable, MD   Other Instructions      Signed, Kate Sable, MD  07/18/2020 9:02 AM    [place]

## 2020-07-18 NOTE — Patient Instructions (Signed)
Medication Instructions:  No changes *If you need a refill on your cardiac medications before your next appointment, please call your pharmacy*   Lab Work: None ordered If you have labs (blood work) drawn today and your tests are completely normal, you will receive your results only by: Marland Kitchen MyChart Message (if you have MyChart) OR . A paper copy in the mail If you have any lab test that is abnormal or we need to change your treatment, we will call you to review the results.   Testing/Procedures: None ordered   Follow-Up: At [facility], you and your health needs are our priority.  As part of our continuing mission to provide you with exceptional heart care, we have created designated Provider Care Teams.  These Care Teams include your primary Cardiologist (physician) and Advanced Practice Providers (APPs -  Physician Assistants and Nurse Practitioners) who all work together to provide you with the care you need, when you need it.  We recommend signing up for the patient portal called "MyChart".  Sign up information is provided on this After Visit Summary.  MyChart is used to connect with patients for Virtual Visits (Telemedicine).  Patients are able to view lab/test results, encounter notes, upcoming appointments, etc.  Non-urgent messages can be sent to your provider as well.   To learn more about what you can do with MyChart, go to [email or web address].    Your next appointment:   12 month(s)  The format for your next appointment:   In Person  Provider:   Kate Sable, MD   Other Instructions

## 2020-07-22 MED ORDER — METOPROLOL SUCCINATE ER 25 MG PO TB24: 25.0000 mg | ORAL_TABLET | Freq: Every day | ORAL | 2 refills | Status: DC

## 2020-08-26 ENCOUNTER — Ambulatory Visit: Payer: Medicare HMO | Admitting: Dietician

## 2020-08-29 ENCOUNTER — Other Ambulatory Visit: Payer: Self-pay | Admitting: Internal Medicine

## 2020-08-29 NOTE — Telephone Encounter (Signed)
Historical medication  Last OV: 01/06/2020 Next OV: not scheduled

## 2020-10-12 ENCOUNTER — Ambulatory Visit (INDEPENDENT_AMBULATORY_CARE_PROVIDER_SITE_OTHER): Payer: Medicare HMO

## 2020-10-12 VITALS — Ht 66.0 in | Wt 154.0 lb

## 2020-10-12 DIAGNOSIS — Z0000 Encounter for general adult medical examination without abnormal findings: Secondary | ICD-10-CM

## 2020-10-12 NOTE — Patient Instructions (Addendum)
Kellie Roberson , Thank you for taking time to come for your Medicare Wellness Visit. I appreciate your ongoing commitment to your health goals. Please review the following plan we discussed and let me know if I can assist you in the future.   These are the goals we discussed:  Goals      Follow up with Primary Care Provider     As needed        This is a list of the screening recommended for you and due dates:  Health Maintenance  Topic Date Due   Hepatitis C Screening: USPSTF Recommendation to screen - Ages 68-79 yo.  Never done   Flu Shot  11/28/2020*   Zoster (Shingles) Vaccine (1 of 2) 01/12/2021*   Tetanus Vaccine  10/12/2021*   COVID-19 Vaccine (5 - Booster for Moderna series) 10/25/2020   Mammogram  10/26/2021   Cologuard (Stool DNA test)  11/30/2022   DEXA scan (bone density measurement)  Completed   HPV Vaccine  Aged Out   Pneumonia vaccines  Discontinued  *Topic was postponed. The date shown is not the original due date.    Advanced directives: End of life planning; Advance aging; Advanced directives discussed.  Copy of current HCPOA/Living Will requested.    Conditions/risks identified: none new.  Follow up in one year for your annual wellness visit.   Preventive Care 52 Years and Older, Female Preventive care refers to lifestyle choices and visits with your health care provider that can promote health and wellness. What does preventive care include? A yearly physical exam. This is also called an annual well check. Dental exams once or twice a year. Routine eye exams. Ask your health care provider how often you should have your eyes checked. Personal lifestyle choices, including: Daily care of your teeth and gums. Regular physical activity. Eating a healthy diet. Avoiding tobacco and drug use. Limiting alcohol use. Practicing safe sex. Taking low-dose aspirin every day. Taking vitamin and mineral supplements as recommended by your health care provider. What  happens during an annual well check? The services and screenings done by your health care provider during your annual well check will depend on your age, overall health, lifestyle risk factors, and family history of disease. Counseling  Your health care provider may ask you questions about your: Alcohol use. Tobacco use. Drug use. Emotional well-being. Home and relationship well-being. Sexual activity. Eating habits. History of falls. Memory and ability to understand (cognition). Work and work Statistician. Reproductive health. Screening  You may have the following tests or measurements: Height, weight, and BMI. Blood pressure. Lipid and cholesterol levels. These may be checked every 5 years, or more frequently if you are over 49 years old. Skin check. Lung cancer screening. You may have this screening every year starting at age 36 if you have a 30-pack-year history of smoking and currently smoke or have quit within the past 15 years. Fecal occult blood test (FOBT) of the stool. You may have this test every year starting at age 37. Flexible sigmoidoscopy or colonoscopy. You may have a sigmoidoscopy every 5 years or a colonoscopy every 10 years starting at age 22. Hepatitis C blood test. Hepatitis B blood test. Sexually transmitted disease (STD) testing. Diabetes screening. This is done by checking your blood sugar (glucose) after you have not eaten for a while (fasting). You may have this done every 1-3 years. Bone density scan. This is done to screen for osteoporosis. You may have this done starting at age 89.  Mammogram. This may be done every 1-2 years. Talk to your health care provider about how often you should have regular mammograms. Talk with your health care provider about your test results, treatment options, and if necessary, the need for more tests. Vaccines  Your health care provider may recommend certain vaccines, such as: Influenza vaccine. This is recommended every  year. Tetanus, diphtheria, and acellular pertussis (Tdap, Td) vaccine. You may need a Td booster every 10 years. Zoster vaccine. You may need this after age 33. Pneumococcal 13-valent conjugate (PCV13) vaccine. One dose is recommended after age 4. Pneumococcal polysaccharide (PPSV23) vaccine. One dose is recommended after age 77. Talk to your health care provider about which screenings and vaccines you need and how often you need them. This information is not intended to replace advice given to you by your health care provider. Make sure you discuss any questions you have with your health care provider. Document Released: 03/18/2015 Document Revised: 11/09/2015 Document Reviewed: 12/21/2014 Elsevier Interactive Patient Education  2017 [employer] Prevention in the Home Falls can cause injuries. They can happen to people of all ages. There are many things you can do to make your home safe and to help prevent falls. What can I do on the outside of my home? Regularly fix the edges of walkways and driveways and fix any cracks. Remove anything that might make you trip as you walk through a door, such as a raised step or threshold. Trim any bushes or trees on the path to your home. Use bright outdoor lighting. Clear any walking paths of anything that might make someone trip, such as rocks or tools. Regularly check to see if handrails are loose or broken. Make sure that both sides of any steps have handrails. Any raised decks and porches should have guardrails on the edges. Have any leaves, snow, or ice cleared regularly. Use sand or salt on walking paths during winter. Clean up any spills in your garage right away. This includes oil or grease spills. What can I do in the bathroom? Use night lights. Install grab bars by the toilet and in the tub and shower. Do not use towel bars as grab bars. Use non-skid mats or decals in the tub or shower. If you need to sit down in the shower, use a  plastic, non-slip stool. Keep the floor dry. Clean up any water that spills on the floor as soon as it happens. Remove soap buildup in the tub or shower regularly. Attach bath mats securely with double-sided non-slip rug tape. Do not have throw rugs and other things on the floor that can make you trip. What can I do in the bedroom? Use night lights. Make sure that you have a light by your bed that is easy to reach. Do not use any sheets or blankets that are too big for your bed. They should not hang down onto the floor. Have a firm chair that has side arms. You can use this for support while you get dressed. Do not have throw rugs and other things on the floor that can make you trip. What can I do in the kitchen? Clean up any spills right away. Avoid walking on wet floors. Keep items that you use a lot in easy-to-reach places. If you need to reach something above you, use a strong step stool that has a grab bar. Keep electrical cords out of the way. Do not use floor polish or wax that makes floors slippery. If  you must use wax, use non-skid floor wax. Do not have throw rugs and other things on the floor that can make you trip. What can I do with my stairs? Do not leave any items on the stairs. Make sure that there are handrails on both sides of the stairs and use them. Fix handrails that are broken or loose. Make sure that handrails are as long as the stairways. Check any carpeting to make sure that it is firmly attached to the stairs. Fix any carpet that is loose or worn. Avoid having throw rugs at the top or bottom of the stairs. If you do have throw rugs, attach them to the floor with carpet tape. Make sure that you have a light switch at the top of the stairs and the bottom of the stairs. If you do not have them, ask someone to add them for you. What else can I do to help prevent falls? Wear shoes that: Do not have high heels. Have rubber bottoms. Are comfortable and fit you  well. Are closed at the toe. Do not wear sandals. If you use a stepladder: Make sure that it is fully opened. Do not climb a closed stepladder. Make sure that both sides of the stepladder are locked into place. Ask someone to hold it for you, if possible. Clearly mark and make sure that you can see: Any grab bars or handrails. First and last steps. Where the edge of each step is. Use tools that help you move around (mobility aids) if they are needed. These include: Canes. Walkers. Scooters. Crutches. Turn on the lights when you go into a dark area. Replace any light bulbs as soon as they burn out. Set up your furniture so you have a clear path. Avoid moving your furniture around. If any of your floors are uneven, fix them. If there are any pets around you, be aware of where they are. Review your medicines with your doctor. Some medicines can make you feel dizzy. This can increase your chance of falling. Ask your doctor what other things that you can do to help prevent falls. This information is not intended to replace advice given to you by your health care provider. Make sure you discuss any questions you have with your health care provider. Document Released: 12/16/2008 Document Revised: 07/28/2015 Document Reviewed: 03/26/2014 Elsevier Interactive Patient Education  2017 [employer].

## 2020-10-12 NOTE — Progress Notes (Addendum)
Subjective:   Kellie Roberson is a 75 y.o. female who presents for Medicare Annual (Subsequent) preventive examination.  Review of Systems    No ROS.  Medicare Wellness Virtual Visit.  Visual/audio telehealth visit, UTA vital signs.   See social history for additional risk factors.   Cardiac Risk Factors include: advanced age (>37mn, >>90women)     Objective:    Today's Vitals   10/12/20 1317  Weight: 154 lb (69.9 kg)  Height: '5\' 6"'$  (1.676 m)   Body mass index is 24.86 kg/m.  Advanced Directives 10/12/2020 02/01/2020 10/12/2019 12/20/2018 10/06/2018  Does Patient Have a Medical Advance Directive? Yes No Yes No Yes  Type of [place] - [place] - Living will;Healthcare Power of Attorney  Does patient want to make changes to medical advance directive? No - Patient declined - No - Patient declined - No - Patient declined  Copy of [place]? No - copy requested - No - copy requested - No - copy requested    Current Medications (verified) Outpatient Encounter Medications as of 10/12/2020  Medication Sig   Ascorbic Acid (VITAMIN C) 1000 MG tablet Take 1,000 mg by mouth daily.   estradiol (CLIMARA - DOSED IN MG/24 HR) 0.025 mg/24hr patch USE AS DIRECTED   meloxicam (MOBIC) 15 MG tablet Take 1 tablet (15 mg total) by mouth daily as needed for pain.   metoprolol succinate (TOPROL XL) 25 MG 24 hr tablet Take 1 tablet (25 mg total) by mouth daily.   Multiple Vitamin (MULTIVITAMIN WITH MINERALS) TABS tablet Take 1 tablet by mouth daily.   Omega-3 Fatty Acids (FISH OIL) 1000 MG CAPS Take 1,000 mg by mouth daily.   Probiotic Product (PROBIOTIC-10 PO) Take by mouth daily.   SYNTHROID 100 MCG tablet TAKE 1 TABLET (100 MCG TOTAL) BY MOUTH DAILY. NAME BRAND ONLY   No facility-administered encounter medications on file as of 10/12/2020.    Allergies (verified) Patient has no known  allergies.   History: Past Medical History:  Diagnosis Date   [place]   skin ca-basal cell   Thyroid disease    Past Surgical History:  Procedure Laterality Date   ABDOMINAL HYSTERECTOMY  2003   total-had some structural damage after 2nd pregnancy   THYROIDECTOMY  1975   was hyperthyroid   Family History  Problem Relation Age of Onset   Alzheimer's disease Mother    Hypertension Father    Glaucoma Father    Thyroid disease Sister    Mitral valve prolapse Sister    Breast cancer Neg Hx    Social History   Socioeconomic History   Marital status: Married    Spouse name: Not on file   Number of children: Not on file   Years of education: Not on file   Highest education level: Not on file  Occupational History   Not on file  Tobacco Use   Smoking status: Never   Smokeless tobacco: Never  Vaping Use   Vaping Use: Never used  Substance and Sexual Activity   Alcohol use: Yes    Comment: social   Drug use: No   Sexual activity: Not on file  Other Topics Concern   Not on file  Social History Narrative   Not on file   Social Determinants of Health   Financial Resource Strain: Low Risk    Difficulty of Paying Living Expenses: Not hard at all  Food Insecurity: No Food Insecurity   Worried About Charity fundraiser in the Last Year: Never true   Ran Out of Food in the Last Year: Never true  Transportation Needs: No Transportation Needs   Lack of Transportation (Medical): No   Lack of Transportation (Non-Medical): No  Physical Activity: Sufficiently Active   Days of Exercise per Week: 5 days   Minutes of Exercise per Session: 60 min  Stress: No Stress Concern Present   Feeling of Stress : Not at all  Social Connections: Unknown   Frequency of Communication with Friends and Family: More than three times a week   Frequency of Social Gatherings with Friends and Family: More than three times a week   Attends Religious Services: Not on Music therapist or Organizations: Not on file   Attends Archivist Meetings: Not on file   Marital Status: Married    Tobacco Counseling Counseling given: Not Answered   Clinical Intake:  Pre-visit preparation completed: Yes        Diabetes: No  How often do you need to have someone help you when you read instructions, pamphlets, or other written materials from your doctor or pharmacy?: 1 - Never    Interpreter Needed?: No      Activities of Daily Living In your present state of health, do you have any difficulty performing the following activities: 10/12/2020  Hearing? N  Vision? N  Difficulty concentrating or making decisions? N  Walking or climbing stairs? N  Dressing or bathing? N  Doing errands, shopping? N  Preparing Food and eating ? N  Using the Toilet? N  In the past six months, have you accidently leaked urine? N  Do you have problems with loss of bowel control? N  Managing your Medications? N  Managing your Finances? N  Housekeeping or managing your Housekeeping? N  Some recent data might be hidden    Patient Care Team: [name], MD as PCP - General (Internal Medicine) Kate Sable, MD as PCP - Cardiology (Cardiology)  Indicate any recent Medical Services you may have received from other than [facility] providers in the past year (date may be approximate).     Assessment:   This is a routine wellness examination for Kellie Roberson.  I connected with Kellie Roberson today by telephone and verified that I am speaking with the correct person using two identifiers. Location patient: home Location provider: work Persons participating in the virtual visit: patient, Marine scientist.    I discussed the limitations, risks, security and privacy concerns of performing an evaluation and management service by telephone and the availability of in person appointments. The patient expressed understanding and verbally consented to this telephonic visit.    Interactive  audio and video telecommunications were attempted between this provider and patient, however failed, due to patient having technical difficulties OR patient did not have access to video capability.  We continued and completed visit with audio only.  Some vital signs may be absent or patient reported.   Hearing/Vision screen Hearing Screening - Comments:: Patient is able to hear conversational tones without difficulty.  No issues reported. Vision Screening - Comments:: Followed by Dr. Ellin Mayhew  Wears corrective lenses  They have seen their ophthalmologist in the last 12 months.   Dietary issues and exercise activities discussed: Current Exercise Habits: Home exercise routine, Type of exercise: calisthenics;walking, Time (Minutes): 30, Frequency (Times/Week): 5, Weekly Exercise (Minutes/Week): 150, Intensity: Mild Healthy diet Good water intake  Goals Addressed             This Visit's Progress    Follow up with Primary Care Provider       As needed       Depression Screen PHQ 2/9 Scores 10/12/2020 10/12/2019 10/06/2018 07/25/2018 04/23/2016  PHQ - 2 Score 0 0 0 0 0  PHQ- 9 Score - - - 0 -    Fall Risk Fall Risk  10/12/2020 01/06/2020 11/04/2019 10/28/2019 10/12/2019  Falls in the past year? 0 0 0 0 0  Number falls in past yr: 0 - - - 0  Injury with Fall? 0 - - - -  Follow up Falls evaluation completed Falls evaluation completed Falls evaluation completed Falls evaluation completed Falls evaluation completed    [place]: Adequate lighting in your home to reduce risk of falls? Yes   ASSISTIVE DEVICES UTILIZED TO PREVENT FALLS: Use of a cane, walker or w/c? No   TIMED UP AND GO: Was the test performed? No .   Cognitive Function: Patient is alert and oriented x3.  MMSE/6CIT deferred. Normal by direct communication/observation.  MMSE - Mini Mental State Exam 10/12/2019  Not completed: Unable to complete     6CIT Screen 10/06/2018  What Year? 0  points  What month? 0 points  What time? 0 points  Count back from 20 0 points  Months in reverse 0 points  Repeat phrase 0 points  Total Score 0    Immunizations Immunization History  Administered Date(s) Administered   Fluad Quad(high Dose 65+) 11/15/2018   Influenza, High Dose Seasonal PF 11/25/2019   Moderna Sars-Covid-2 Vaccination 04/06/2019, 05/04/2019, 12/30/2019   Pneumococcal Conjugate-13 11/04/2015   Pneumococcal Polysaccharide-23 07/11/2017   Td 03/05/2010    TDAP status: Due, Education has been provided regarding the importance of this vaccine. Advised may receive this vaccine at local pharmacy or Health Dept. Aware to provide a copy of the vaccination record if obtained from local pharmacy or Health Dept. Verbalized acceptance and understanding. Deferred.   Health Maintenance Health Maintenance  Topic Date Due   Hepatitis C Screening  Never done   INFLUENZA VACCINE  11/28/2020 (Originally 10/03/2020)   Zoster Vaccines- Shingrix (1 of 2) 01/12/2021 (Originally 07/04/1964)   TETANUS/TDAP  10/12/2021 (Originally 03/05/2020)   COVID-19 Vaccine (5 - Booster for Moderna series) 10/25/2020   MAMMOGRAM  10/26/2021   Fecal DNA (Cologuard)  11/30/2022   DEXA SCAN  Completed   HPV VACCINES  Aged Out   PNA vac Low Risk Adult  Discontinued   Hepatitis C Screening: Deferred.   Vision Screening: Recommended annual ophthalmology exams for early detection of glaucoma and other disorders of the eye.  Dental Screening: Recommended annual dental exams for proper oral hygiene. Visits every 6 months.   Community Resource Referral / Chronic Care Management: CRR required this visit?  No   CCM required this visit?  No      Plan:   Keep all routine maintenance appointments.   I have personally reviewed and noted the following in the patient's chart:   Medical and social history Use of alcohol, tobacco or illicit drugs  Current medications and supplements including opioid  prescriptions. Patient is not currently taking opioid.  Functional ability and status Nutritional status Physical activity Advanced directives List of other physicians Hospitalizations, surgeries, and ER visits in previous 12 months Vitals Screenings to include cognitive, depression, and falls Referrals and appointments  In addition, I have reviewed  and discussed with patient certain preventive protocols, quality metrics, and best practice recommendations. A written personalized care plan for preventive services as well as general preventive health recommendations were provided to patient via mychart.     OBrien-Blaney, Jhovany Weidinger L, LPN   [id number]    I have reviewed the above information and agree with above.   Deborra Medina, MD

## 2020-10-20 ENCOUNTER — Ambulatory Visit: Payer: Medicare HMO | Admitting: Dietician

## 2020-11-09 ENCOUNTER — Encounter: Payer: Medicare HMO | Attending: Cardiology | Admitting: Dietician

## 2020-11-09 ENCOUNTER — Other Ambulatory Visit: Payer: Self-pay

## 2020-11-09 ENCOUNTER — Encounter: Payer: Self-pay | Admitting: Dietician

## 2020-11-09 VITALS — Ht 66.0 in | Wt 158.3 lb

## 2020-11-09 DIAGNOSIS — E785 Hyperlipidemia, unspecified: Secondary | ICD-10-CM | POA: Diagnosis present

## 2020-11-09 NOTE — Progress Notes (Signed)
Medical Nutrition Therapy: Visit start time: 0820  end time: 0920  Assessment:  Diagnosis: hyperlipidemia Past medical history: hypothyroidism Psychosocial issues/ stress concerns: none  Preferred learning method:  Auditory Visual   Current weight: 158.3lbs with shoes Height: 5'6" BMI: 25.55 Medications, supplements: reconciled list in medical record  Progress and evaluation:  Patient reports generally eating healthy foods; she has read multiple nutrition resources over time. She has a good knowledge base of types of fats, role of fiber in heart health. She and spouse maintain a vegetable garden and freeze a significant amount of produce. Husband feels cheese and butter (tries to limit) could be having some effect on cholesterol  Limits salt significantly; black pepper Recent labwork indicates Total cholesterol 220, LDL 121, HDL 86, triglycerides 65 (07/14/20)  Physical activity: 60-180 minutes daily swimimng, walking/ hiking, gardening/ lawn care  Dietary Intake:  Usual eating pattern includes 3 meals and 0-1 snacks per day. Dining out frequency: 0-1 meals per week.  Breakfast: egg maybe 1x a week; high fiber cereal with fruit; lowfat cottage cheese / greek yogurt with fruit; 1x a week [place] toast with canadian bacon; coffee daily Snack: none Lunch: usually a light meal -- apple with peanut butter; tuna 2x a week maybe with ww carckers; occ sliced [place] and provolone roll-up, fruit; carrots and hummus Snack: none Supper: husband cooks, variety -- uses olive oil -- usu chicken/ fish + seasonal veg from garden, salad, lima beans, field peas, sweet potato, butternut squash, corn, tomatoes; 1 glass wine, rarely dessert  Snack: none Beverages: water  Nutrition Care Education: Topics covered:  Basic nutrition: appropriate nutrient balance, appropriate meal and snack schedule, general nutrition guidelines    Weight control: discussed healthy weight range as up to BMI of 27 when over  76years of age, and importance of focus on body composition rather than only weight; estimated energy needs for slight (10-15lb) weight loss at 1400 kcal, provided guidance for 45%CHO, 25% pro, 30% fat Hyperlipidemia:  target goals for lipids; healthy and unhealthy fats; role of fiber and soluble fiber; plant sterols/ stanols; role of exercise, stress management   Nutritional Diagnosis:  [place]-2.2 Altered nutrition-related laboratory As related to hypercholesterolemia.  As evidenced by elevated total cholesterol and LDL.  Intervention:  Instruction and discussion as noted above. Patient is supported by spouse; both are interested in following healthy eating pattern to reduce health risk.  Patient will plan to further reduce intake of cheese and perhaps butter. She will work to monitor caloric intake to promote some weight loss. No follow up MNT needed at this time; patient to schedule later as needed.  Education Materials given:  Food to Choose to lower cholesterol (AHA) Plate Planner with food lists, sample meal pattern Visit summary with goals/ instructions   Learner/ who was taught:  Patient  Spouse/ partner   Level of understanding: Verbalizes/ demonstrates competency   Demonstrated degree of understanding via:   Teach back Learning barriers: None  Willingness to learn/ readiness for change: Eager, change in progress   Monitoring and Evaluation:  Dietary intake, exercise, blood lipids, and body weight      follow up: prn

## 2020-11-09 NOTE — Patient Instructions (Signed)
Great job making healthy food choices and staying active! Add some seeds into meals for fiber and healthy fats. Keep butter and cheese to small portions.

## 2020-12-02 ENCOUNTER — Other Ambulatory Visit: Payer: Self-pay | Admitting: Internal Medicine

## 2020-12-19 ENCOUNTER — Other Ambulatory Visit: Payer: Self-pay | Admitting: Internal Medicine

## 2020-12-23 ENCOUNTER — Telehealth: Payer: Self-pay | Admitting: Internal Medicine

## 2020-12-23 ENCOUNTER — Other Ambulatory Visit: Payer: Self-pay | Admitting: Internal Medicine

## 2020-12-28 ENCOUNTER — Ambulatory Visit (INDEPENDENT_AMBULATORY_CARE_PROVIDER_SITE_OTHER): Payer: Medicare HMO | Admitting: Internal Medicine

## 2020-12-28 ENCOUNTER — Ambulatory Visit (INDEPENDENT_AMBULATORY_CARE_PROVIDER_SITE_OTHER): Payer: Medicare HMO

## 2020-12-28 ENCOUNTER — Other Ambulatory Visit: Payer: Self-pay

## 2020-12-28 ENCOUNTER — Encounter: Payer: Self-pay | Admitting: Internal Medicine

## 2020-12-28 VITALS — BP 120/68 | HR 71 | Temp 95.9°F | Ht 66.0 in | Wt 155.6 lb

## 2020-12-28 DIAGNOSIS — Z0000 Encounter for general adult medical examination without abnormal findings: Secondary | ICD-10-CM

## 2020-12-28 DIAGNOSIS — R053 Chronic cough: Secondary | ICD-10-CM | POA: Diagnosis not present

## 2020-12-28 DIAGNOSIS — E890 Postprocedural hypothyroidism: Secondary | ICD-10-CM | POA: Diagnosis not present

## 2020-12-28 DIAGNOSIS — Z1231 Encounter for screening mammogram for malignant neoplasm of breast: Secondary | ICD-10-CM | POA: Diagnosis not present

## 2020-12-28 DIAGNOSIS — I700 Atherosclerosis of aorta: Secondary | ICD-10-CM | POA: Diagnosis not present

## 2020-12-28 DIAGNOSIS — E782 Mixed hyperlipidemia: Secondary | ICD-10-CM

## 2020-12-28 DIAGNOSIS — R030 Elevated blood-pressure reading, without diagnosis of hypertension: Secondary | ICD-10-CM | POA: Diagnosis not present

## 2020-12-28 DIAGNOSIS — Z780 Asymptomatic menopausal state: Secondary | ICD-10-CM

## 2020-12-28 DIAGNOSIS — Q2112 Patent foramen ovale: Secondary | ICD-10-CM | POA: Diagnosis not present

## 2020-12-28 DIAGNOSIS — R059 Cough, unspecified: Secondary | ICD-10-CM

## 2020-12-28 MED ORDER — ZOSTER VAC RECOMB ADJUVANTED 50 MCG/0.5ML IM SUSR: 0.5000 mL | Freq: Once | INTRAMUSCULAR | 1 refills | Status: AC

## 2020-12-28 MED ORDER — ROSUVASTATIN CALCIUM 10 MG PO TABS: 10.0000 mg | ORAL_TABLET | ORAL | 1 refills | Status: DC

## 2020-12-28 MED ORDER — TETANUS-DIPHTH-ACELL PERTUSSIS 5-2.5-18.5 LF-MCG/0.5 IM SUSY
0.5000 mL | PREFILLED_SYRINGE | Freq: Once | INTRAMUSCULAR | 0 refills | Status: AC
Start: 2020-12-28 — End: 2020-12-28

## 2020-12-28 MED ORDER — ASPIRIN 81 MG PO TBEC: 81.0000 mg | DELAYED_RELEASE_TABLET | Freq: Every day | ORAL | 12 refills | Status: AC

## 2020-12-28 NOTE — Patient Instructions (Addendum)
  Your aortic atherosclerosis places you at increased risk for heart attack and stroke  I recommend that you repeat a trial of Crestor .  Start with one tablet twice a week  (NOT DAILY).    Return for liver enzymes after 3 weeks of therapy   You also have a patent foramen ovale.   I recommend adding 81 mg aspirin daily   The Tdap and Shingrix vaccines will be COVERED BY MEDICARE if you get them at your pharmacy after January 1

## 2020-12-28 NOTE — Progress Notes (Signed)
Patient ID: Kellie Roberson, female    DOB: 1945/04/04  Age: 75 y.o. MRN: [id number]  The patient is here for annual preventive examination and management of other chronic and acute problems.   The risk factors are reflected in the social history.  The roster of all physicians providing medical care to patient - is listed in the Snapshot section of the chart.  Activities of daily living:  The patient is 100% independent in all ADLs: dressing, toileting, feeding as well as independent mobility  Home safety : The patient has smoke detectors in the home. They wear seatbelts.  There are no firearms at home. There is no violence in the home.   There is no risks for hepatitis, STDs or HIV. There is no   history of blood transfusion. They have no travel history to infectious disease endemic areas of the world.  The patient has seen their dentist in the last six month. They have seen their eye doctor in the last year. They admit to slight hearing difficulty with regard to whispered voices and some television programs.  They have deferred audiologic testing in the last year.  They do not  have excessive sun exposure. Discussed the need for sun protection: hats, long sleeves and use of sunscreen if there is significant sun exposure.   Diet: the importance of a healthy diet is discussed. They do have a healthy diet.  The benefits of regular aerobic exercise were discussed. She walks 4 times per week ,  20 minutes.   Depression screen: there are no signs or vegative symptoms of depression- irritability, change in appetite, anhedonia, sadness/tearfullness.  Cognitive assessment: the patient manages all their financial and personal affairs and is actively engaged. They could relate day,date,year and events; recalled 2/3 objects at 3 minutes; performed clock-face test normally.  The following portions of the patient's history were reviewed and updated as appropriate: allergies, current medications, past family  history, past medical history,  past surgical history, past social history  and problem list.  Visual acuity was not assessed per patient preference since she has regular follow up with her ophthalmologist. Hearing and body mass index were assessed and reviewed.   During the course of the visit the patient was educated and counseled about appropriate screening and preventive services including : fall prevention , diabetes screening, nutrition counseling, colorectal cancer screening, and recommended immunizations.    CC: The primary encounter diagnosis was Postoperative hypothyroidism. Diagnoses of White coat syndrome with high blood pressure but without hypertension, Postmenopausal estrogen deficiency, Breast cancer screening by mammogram, Mixed hyperlipidemia, Chronic cough, Encounter for preventive health examination, Thoracic aortic atherosclerosis ([place]), Cough in adult patient, and Patent foramen ovale were also pertinent to this visit.  1) grief:  lost her sister recently to acute respiratory failure secondary to AIP.   2) Reviewed findings of coronary calcium CT scan today..  she has been reluctant to start statin therapy advised by her cardiologist.  She has Aortic arch atherosclerosis:  Reviewed findings of prior CT scan today..   Discussed the role of statin therapy in stablizing placque and preventing events due to placque rupture.      History Kellie Roberson has a past medical history of Cancer ([place]) (09/22/2018) and Thyroid disease.   She has a past surgical history that includes Abdominal hysterectomy (2003) and Thyroidectomy (1975).   Her family history includes Alzheimer's disease in her mother; Glaucoma in her father; Hypertension in her father; Mitral valve prolapse in her sister; Thyroid disease  in her sister.She reports that she has never smoked. She has never used smokeless tobacco. She reports current alcohol use of about 10.0 standard drinks per week. She reports that she does not use  drugs.  Outpatient Medications Prior to Visit  Medication Sig Dispense Refill   Ascorbic Acid (VITAMIN C) 1000 MG tablet Take 1,000 mg by mouth daily.     estradiol (CLIMARA - DOSED IN MG/24 HR) 0.025 mg/24hr patch USE AS DIRECTED 4 patch 0   meloxicam (MOBIC) 15 MG tablet TAKE 1 TABLET BY MOUTH EVERY DAY AS NEEDED FOR PAIN 90 tablet 0   metoprolol succinate (TOPROL XL) 25 MG 24 hr tablet Take 1 tablet (25 mg total) by mouth daily. 90 tablet 2   Multiple Vitamin (MULTIVITAMIN WITH MINERALS) TABS tablet Take 1 tablet by mouth daily.     Omega-3 Fatty Acids (FISH OIL) 1000 MG CAPS Take 1,000 mg by mouth daily.     Probiotic Product (PROBIOTIC-10 PO) Take by mouth daily.     MODERNA COVID-19 VACCINE 100 MCG/0.5ML injection      SYNTHROID 100 MCG tablet TAKE 1 TABLET (100 MCG TOTAL) BY MOUTH DAILY. NAME BRAND ONLY 90 tablet 1   No facility-administered medications prior to visit.    Review of Systems  Patient denies headache, fevers, malaise, unintentional weight loss, skin rash, eye pain, sinus congestion and sinus pain, sore throat, dysphagia,  hemoptysis , cough, dyspnea, wheezing, chest pain, palpitations, orthopnea, edema, abdominal pain, nausea, melena, diarrhea, constipation, flank pain, dysuria, hematuria, urinary  Frequency, nocturia, numbness, tingling, seizures,  Focal weakness, Loss of consciousness,  Tremor, insomnia, depression, anxiety, and suicidal ideation.     Objective:  BP 120/68 (BP Location: Left Arm, Patient Position: Sitting, Cuff Size: Normal)   Pulse 71   Temp (!) 95.9 F (35.5 C) (Temporal)   Ht 5\' 6"  (1.676 m)   Wt 155 lb 9.6 oz (70.6 kg)   SpO2 99%   BMI 25.11 kg/m   Physical Exam   General appearance: alert, cooperative and appears stated age Head: Normocephalic, without obvious abnormality, atraumatic Eyes: conjunctivae/corneas clear. PERRL, EOM's intact. Fundi benign. Ears: normal TM's and external ear canals both ears Nose: Nares normal. Septum  midline. Mucosa normal. No drainage or sinus tenderness. Throat: lips, mucosa, and tongue normal; teeth and gums normal Neck: no adenopathy, no carotid bruit, no JVD, supple, symmetrical, trachea midline and thyroid not enlarged, symmetric, no tenderness/mass/nodules Lungs: clear to auscultation bilaterally Breasts: normal appearance, no masses or tenderness Heart: regular rate and rhythm, S1, S2 normal, no murmur, click, rub or gallop Abdomen: soft, non-tender; bowel sounds normal; no masses,  no organomegaly Extremities: extremities normal, atraumatic, no cyanosis or edema Pulses: 2+ and symmetric Skin: Skin color, texture, turgor normal. No rashes or lesions Neurologic: Alert and oriented X 3, normal strength and tone. Normal symmetric reflexes. Normal coordination and gait.     Assessment & Plan:   Problem List Items Addressed This Visit     Cough in adult patient    Chest x ray was normal today      Encounter for preventive health examination    age appropriate education and counseling updated, referrals for preventative services and immunizations addressed, dietary and smoking counseling addressed, most recent labs reviewed.  I have personally reviewed and have noted:   1) the patient's medical and social history 2) The pt's use of alcohol, tobacco, and illicit drugs 3) The patient's current medications and supplements 4) Functional ability including ADL's, fall  risk, home safety risk, hearing and visual impairment 5) Diet and physical activities 6) Evidence for depression or mood disorder 7) The patient's height, weight, and BMI have been recorded in the chart   I have made referrals, and provided counseling and education based on review of the above      Patent foramen ovale    Advised to start 81 mg aspirin daily       Relevant Medications   rosuvastatin (CRESTOR) 10 MG tablet   aspirin 81 MG EC tablet   Postoperative hypothyroidism - Primary   Relevant Orders    TSH (Completed)   Thoracic aortic atherosclerosis ([place])    Noted on coronary calcium CT scan.  statin therapy advised and accepted   Lab Results  Component Value Date   CHOL 220 (H) 07/14/2020   HDL 86 07/14/2020   LDLCALC 121 (H) 07/14/2020   TRIG 65 07/14/2020   CHOLHDL 2.6 07/14/2020         Relevant Medications   rosuvastatin (CRESTOR) 10 MG tablet   aspirin 81 MG EC tablet   White coat syndrome with high blood pressure but without hypertension   Relevant Medications   rosuvastatin (CRESTOR) 10 MG tablet   aspirin 81 MG EC tablet   Other Relevant Orders   Comprehensive metabolic panel (Completed)   Other Visit Diagnoses     Postmenopausal estrogen deficiency       Relevant Orders   DG Bone Density   Breast cancer screening by mammogram       Relevant Orders   MM 3D SCREEN BREAST BILATERAL   Mixed hyperlipidemia       Relevant Medications   rosuvastatin (CRESTOR) 10 MG tablet   aspirin 81 MG EC tablet   Other Relevant Orders   Comprehensive metabolic panel   Chronic cough       Relevant Orders   DG Chest 2 View (Completed)       Meds ordered this encounter  Medications   rosuvastatin (CRESTOR) 10 MG tablet    Sig: Take 1 tablet (10 mg total) by mouth 2 (two) times a week.    Dispense:  24 tablet    Refill:  1   aspirin 81 MG EC tablet    Sig: Take 1 tablet (81 mg total) by mouth daily. Swallow whole.    Dispense:  30 tablet    Refill:  12   Tdap (BOOSTRIX) 5-2.5-18.5 LF-MCG/0.5 injection    Sig: Inject 0.5 mLs into the muscle once for 1 dose.    Dispense:  0.5 mL    Refill:  0   Zoster Vaccine Adjuvanted [facility]) injection    Sig: Inject 0.5 mLs into the muscle once for 1 dose.    Dispense:  1 each    Refill:  1    Medications Discontinued During This Encounter  Medication Reason   MODERNA COVID-19 VACCINE 100 MCG/0.5ML injection Error    Follow-up: Return in about 1 year (around 12/28/2021).   [name], MD

## 2020-12-29 ENCOUNTER — Other Ambulatory Visit: Payer: Self-pay | Admitting: Internal Medicine

## 2020-12-29 LAB — COMPREHENSIVE METABOLIC PANEL
ALT: 27 U/L (ref 0–35)
AST: 24 U/L (ref 0–37)
Albumin: 4.1 g/dL (ref 3.5–5.2)
Alkaline Phosphatase: 72 U/L (ref 39–117)
BUN: 17 mg/dL (ref 6–23)
CO2: 28 mEq/L (ref 19–32)
Calcium: 9.1 mg/dL (ref 8.4–10.5)
Chloride: 104 mEq/L (ref 96–112)
Creatinine, Ser: 0.66 mg/dL (ref 0.40–1.20)
GFR: 86.98 mL/min (ref >60.00)
Glucose, Bld: 119 mg/dL — ABNORMAL HIGH (ref 70–99)
Potassium: 3.9 mEq/L (ref 3.5–5.1)
Sodium: 140 mEq/L (ref 135–145)
Total Bilirubin: 1.3 mg/dL — ABNORMAL HIGH (ref 0.2–1.2)
Total Protein: 6.7 g/dL (ref 6.0–8.3)

## 2020-12-29 LAB — TSH: TSH: 1.01 u[IU]/mL (ref 0.35–5.50)

## 2020-12-31 DIAGNOSIS — R059 Cough, unspecified: Secondary | ICD-10-CM | POA: Insufficient documentation

## 2020-12-31 DIAGNOSIS — I700 Atherosclerosis of aorta: Secondary | ICD-10-CM | POA: Insufficient documentation

## 2020-12-31 DIAGNOSIS — Q2112 Patent foramen ovale: Secondary | ICD-10-CM | POA: Insufficient documentation

## 2020-12-31 NOTE — Assessment & Plan Note (Signed)
Advised to start 81 mg aspirin daily

## 2020-12-31 NOTE — Assessment & Plan Note (Signed)
Noted on coronary calcium CT scan.  statin therapy advised and accepted   Lab Results  Component Value Date   CHOL 220 (H) 07/14/2020   HDL 86 07/14/2020   LDLCALC 121 (H) 07/14/2020   TRIG 65 07/14/2020   CHOLHDL 2.6 07/14/2020

## 2020-12-31 NOTE — Assessment & Plan Note (Signed)
Chest x ray was normal today

## 2020-12-31 NOTE — Assessment & Plan Note (Signed)

## 2021-01-02 NOTE — Telephone Encounter (Signed)
Please advise on blood sugar level

## 2021-01-17 DIAGNOSIS — Z872 Personal history of diseases of the skin and subcutaneous tissue: Secondary | ICD-10-CM | POA: Diagnosis not present

## 2021-01-17 DIAGNOSIS — L578 Other skin changes due to chronic exposure to nonionizing radiation: Secondary | ICD-10-CM | POA: Diagnosis not present

## 2021-01-17 DIAGNOSIS — Z85828 Personal history of other malignant neoplasm of skin: Secondary | ICD-10-CM | POA: Diagnosis not present

## 2021-01-19 ENCOUNTER — Other Ambulatory Visit: Payer: Medicare HMO

## 2021-01-22 ENCOUNTER — Other Ambulatory Visit: Payer: Self-pay | Admitting: Internal Medicine

## 2021-01-25 ENCOUNTER — Other Ambulatory Visit (INDEPENDENT_AMBULATORY_CARE_PROVIDER_SITE_OTHER): Payer: Medicare HMO

## 2021-01-25 ENCOUNTER — Other Ambulatory Visit: Payer: Self-pay

## 2021-01-25 DIAGNOSIS — E782 Mixed hyperlipidemia: Secondary | ICD-10-CM

## 2021-01-25 LAB — COMPREHENSIVE METABOLIC PANEL
ALT: 22 U/L (ref 0–35)
AST: 23 U/L (ref 0–37)
Albumin: 4 g/dL (ref 3.5–5.2)
Alkaline Phosphatase: 79 U/L (ref 39–117)
BUN: 17 mg/dL (ref 6–23)
CO2: 29 mEq/L (ref 19–32)
Calcium: 9 mg/dL (ref 8.4–10.5)
Chloride: 103 mEq/L (ref 96–112)
Creatinine, Ser: 0.65 mg/dL (ref 0.40–1.20)
GFR: 87.26 mL/min (ref >60.00)
Glucose, Bld: 89 mg/dL (ref 70–99)
Potassium: 4 mEq/L (ref 3.5–5.1)
Sodium: 139 mEq/L (ref 135–145)
Total Bilirubin: 0.9 mg/dL (ref 0.2–1.2)
Total Protein: 6.5 g/dL (ref 6.0–8.3)

## 2021-01-31 DIAGNOSIS — H353131 Nonexudative age-related macular degeneration, bilateral, early dry stage: Secondary | ICD-10-CM | POA: Diagnosis not present

## 2021-03-03 ENCOUNTER — Other Ambulatory Visit: Payer: Self-pay | Admitting: Internal Medicine

## 2021-03-08 ENCOUNTER — Ambulatory Visit
Admission: RE | Admit: 2021-03-08 | Discharge: 2021-03-08 | Disposition: A | Discharge status: Home / Self Care | Payer: Medicare HMO | Source: Ambulatory Visit | Attending: Internal Medicine | Admitting: Internal Medicine

## 2021-03-08 ENCOUNTER — Other Ambulatory Visit: Payer: Self-pay

## 2021-03-08 DIAGNOSIS — Z780 Asymptomatic menopausal state: Secondary | ICD-10-CM | POA: Insufficient documentation

## 2021-03-08 DIAGNOSIS — Z1231 Encounter for screening mammogram for malignant neoplasm of breast: Secondary | ICD-10-CM | POA: Diagnosis not present

## 2021-04-13 ENCOUNTER — Other Ambulatory Visit: Payer: Self-pay

## 2021-04-13 MED ORDER — METOPROLOL SUCCINATE ER 25 MG PO TB24: 25.0000 mg | ORAL_TABLET | Freq: Every day | ORAL | 1 refills | Status: DC

## 2021-06-11 ENCOUNTER — Other Ambulatory Visit: Payer: Self-pay | Admitting: Internal Medicine

## 2021-06-23 ENCOUNTER — Telehealth: Payer: Self-pay | Admitting: *Deleted

## 2021-06-23 DIAGNOSIS — E890 Postprocedural hypothyroidism: Secondary | ICD-10-CM

## 2021-06-23 DIAGNOSIS — R030 Elevated blood-pressure reading, without diagnosis of hypertension: Secondary | ICD-10-CM

## 2021-06-23 DIAGNOSIS — I700 Atherosclerosis of aorta: Secondary | ICD-10-CM

## 2021-06-23 NOTE — Telephone Encounter (Signed)
Please place future orders for lab appt.  

## 2021-06-29 ENCOUNTER — Other Ambulatory Visit: Payer: Medicare HMO

## 2021-07-02 ENCOUNTER — Other Ambulatory Visit: Payer: Self-pay | Admitting: Internal Medicine

## 2021-08-02 ENCOUNTER — Telehealth: Payer: Self-pay | Admitting: Internal Medicine

## 2021-08-02 DIAGNOSIS — R300 Dysuria: Secondary | ICD-10-CM

## 2021-08-02 NOTE — Telephone Encounter (Signed)
Labs ordered to give a specimen tomorrow and video visit scheduled for Monday. Pt is aware that she will need to be watching for a mychart over the weekend incase treatment is needed.

## 2021-08-02 NOTE — Telephone Encounter (Signed)
Pt called wanting to drop off a specimen because she think she may have a UTI. Pt would like to be called

## 2021-08-03 ENCOUNTER — Other Ambulatory Visit (INDEPENDENT_AMBULATORY_CARE_PROVIDER_SITE_OTHER): Payer: Medicare HMO

## 2021-08-03 DIAGNOSIS — B9620 Unspecified Escherichia coli [E. coli] as the cause of diseases classified elsewhere: Secondary | ICD-10-CM

## 2021-08-03 DIAGNOSIS — R300 Dysuria: Secondary | ICD-10-CM | POA: Diagnosis not present

## 2021-08-03 LAB — URINALYSIS, ROUTINE W REFLEX MICROSCOPIC
Bilirubin Urine: NEGATIVE
Hgb urine dipstick: NEGATIVE
Ketones, ur: NEGATIVE
Leukocytes,Ua: NEGATIVE
Nitrite: NEGATIVE
RBC / HPF: NONE SEEN (ref 0–?)
Specific Gravity, Urine: <=1.005 — AB (ref 1.000–1.030)
Total Protein, Urine: NEGATIVE
Urine Glucose: NEGATIVE
Urobilinogen, UA: 0.2 (ref 0.0–1.0)
pH: 6 (ref 5.0–8.0)

## 2021-08-04 ENCOUNTER — Encounter: Payer: Self-pay | Admitting: Internal Medicine

## 2021-08-05 DIAGNOSIS — B9620 Unspecified Escherichia coli [E. coli] as the cause of diseases classified elsewhere: Secondary | ICD-10-CM | POA: Insufficient documentation

## 2021-08-05 LAB — URINE CULTURE
MICRO NUMBER:: 13470562
SPECIMEN QUALITY:: ADEQUATE

## 2021-08-05 MED ORDER — CIPROFLOXACIN HCL 250 MG PO TABS: 250.0000 mg | ORAL_TABLET | Freq: Two times a day (BID) | ORAL | 0 refills | Status: AC

## 2021-08-05 NOTE — Addendum Note (Signed)
Addended by: [name] on: 08/05/2021 03:58 PM   Modules accepted: Orders

## 2021-08-07 ENCOUNTER — Encounter: Payer: Self-pay | Admitting: Internal Medicine

## 2021-08-07 ENCOUNTER — Telehealth (INDEPENDENT_AMBULATORY_CARE_PROVIDER_SITE_OTHER): Payer: Medicare HMO | Admitting: Internal Medicine

## 2021-08-07 VITALS — Ht 66.0 in | Wt 155.0 lb

## 2021-08-07 DIAGNOSIS — N8111 Cystocele, midline: Secondary | ICD-10-CM | POA: Diagnosis not present

## 2021-08-07 DIAGNOSIS — N390 Urinary tract infection, site not specified: Secondary | ICD-10-CM

## 2021-08-07 DIAGNOSIS — R300 Dysuria: Secondary | ICD-10-CM | POA: Diagnosis not present

## 2021-08-07 DIAGNOSIS — B9620 Unspecified Escherichia coli [E. coli] as the cause of diseases classified elsewhere: Secondary | ICD-10-CM

## 2021-08-07 NOTE — Assessment & Plan Note (Signed)
referrring back to Dr Sharlett Iles for symptoms that have progressed and are now causing discomfort

## 2021-08-07 NOTE — Progress Notes (Signed)
Virtual Visit via [facility] [place]  Note  This visit type was conducted due to national recommendations for restrictions regarding the COVID-19 pandemic (e.g. social distancing).  This format is felt to be most appropriate for this patient at this time.  All issues noted in this document were discussed and addressed.  No physical exam was performed (except for noted visual exam findings with Video Visits).   I connected withNAME@ on 08/07/21 at  1:00 PM EDT by a video enabled telemedicine application  and verified that I am speaking with the correct person using two identifiers. Location patient: home Location provider: work or home office Persons participating in the virtual visit: patient, provider  I discussed the limitations, risks, security and privacy concerns of performing an evaluation and management service by telephone and the availability of in person appointments. I also discussed with the patient that there may be a patient responsible charge related to this service. The patient expressed understanding and agreed to proceed.   Reason for visit: dysuria secondary to UTI   HPI:  76 yr old female presents for follow up after starting  cipro on Saturday June 2 for E Coli UTI.  Symptoms of dysuria  and subjective chills, which started on May 31  and UA was obtained on Jun 1 which noted mild pyuria and bacteriuria without hematuria.  Feeling better , Day 2 of cipro.  .  Taking a probiotic.  No recent uti's,  last one in 2021  Cystocele: feels a bulge 24/7 at introitus.  Wants definitive therapy.  Saw [facility] at [place] in 221,  conservative therapy/observation recommended at that time.   ROS: See pertinent positives and negatives per HPI.  Past Medical History:  Diagnosis Date   Cancer ([place]) 09/22/2018   skin ca-basal cell   Thyroid disease     Past Surgical History:  Procedure Laterality Date   ABDOMINAL HYSTERECTOMY  2003   total-had some structural damage after 2nd pregnancy    THYROIDECTOMY  1975   was hyperthyroid    Family History  Problem Relation Age of Onset   Alzheimer's disease Mother    Hypertension Father    Glaucoma Father    Thyroid disease Sister    Mitral valve prolapse Sister    Breast cancer Neg Hx     SOCIAL HX:  reports that she has never smoked. She has never used smokeless tobacco. She reports current alcohol use of about 10.0 standard drinks per week. She reports that she does not use drugs.    Current Outpatient Medications:    Ascorbic Acid (VITAMIN C) 1000 MG tablet, Take 1,000 mg by mouth daily., Disp: , Rfl:    aspirin 81 MG EC tablet, Take 1 tablet (81 mg total) by mouth daily. Swallow whole., Disp: 30 tablet, Rfl: 12   ciprofloxacin (CIPRO) 250 MG tablet, Take 1 tablet (250 mg total) by mouth 2 (two) times daily for 3 days., Disp: 6 tablet, Rfl: 0   estradiol (CLIMARA - DOSED IN MG/24 HR) 0.025 mg/24hr patch, USE AS DIRECTED, Disp: 4 patch, Rfl: 0   meloxicam (MOBIC) 15 MG tablet, TAKE 1 TABLET BY MOUTH EVERY DAY AS NEEDED FOR PAIN, Disp: 90 tablet, Rfl: 0   metoprolol succinate (TOPROL XL) 25 MG 24 hr tablet, Take 1 tablet (25 mg total) by mouth daily., Disp: 90 tablet, Rfl: 1   Multiple Vitamin (MULTIVITAMIN WITH MINERALS) TABS tablet, Take 1 tablet by mouth daily., Disp: , Rfl:    Omega-3 Fatty Acids (FISH OIL)  1000 MG CAPS, Take 1,000 mg by mouth daily., Disp: , Rfl:    Probiotic Product (PROBIOTIC-10 PO), Take by mouth daily., Disp: , Rfl:    SYNTHROID 100 MCG tablet, TAKE 1 TABLET (100 MCG TOTAL) BY MOUTH DAILY., Disp: 90 tablet, Rfl: 1  EXAM:  VITALS per patient if applicable:  GENERAL: alert, oriented, appears well and in no acute distress  HEENT: atraumatic, conjunttiva clear, no obvious abnormalities on inspection of external nose and ears  NECK: normal movements of the head and neck  LUNGS: on inspection no signs of respiratory distress, breathing rate appears normal, no obvious gross SOB, gasping or  wheezing  CV: no obvious cyanosis  MS: moves all visible extremities without noticeable abnormality  PSYCH/NEURO: pleasant and cooperative, no obvious depression or anxiety, speech and thought processing grossly intact  ASSESSMENT AND PLAN:  Discussed the following assessment and plan:  Dysuria - Plan: Urinalysis, Routine w reflex microscopic, Urine Culture  Pelvic relaxation due to cystocele, midline - Plan: Ambulatory referral to Urogynecology  E. coli UTI (urinary tract infection)  E. coli UTI (urinary tract infection) Dysuria improving on day 2 of cipro .  Taking probiotic .  Repeat UA and culture to be obtained if symptoms do not resolve   Pelvic relaxation due to cystocele, midline referrring back to Dr Sharlett Iles for symptoms that have progressed and are now causing discomfort     I discussed the assessment and treatment plan with the patient. The patient was provided an opportunity to ask questions and all were answered. The patient agreed with the plan and demonstrated an understanding of the instructions.   The patient was advised to call back or seek an in-person evaluation if the symptoms worsen or if the condition fails to improve as anticipated.   I spent  18 minutes dedicated to the care of this patient on the date of this encounter to include pre-visit review of hier recent UA and culture  her medical history,  prior evaluatoin by Dr Sharlett Iles (urogyn).  Face-to-face time with the patient , and post visit ordering of testing and therapeutics.    [name], MD

## 2021-08-07 NOTE — Assessment & Plan Note (Signed)
Dysuria improving on day 2 of cipro .  Taking probiotic .  Repeat UA and culture to be obtained if symptoms do not resolve

## 2021-10-06 ENCOUNTER — Ambulatory Visit: Payer: Medicare HMO | Admitting: Cardiology

## 2021-10-06 ENCOUNTER — Encounter: Payer: Self-pay | Admitting: Cardiology

## 2021-10-06 VITALS — BP 138/78 | HR 66 | Ht 66.0 in | Wt 157.0 lb

## 2021-10-06 DIAGNOSIS — I471 Supraventricular tachycardia: Secondary | ICD-10-CM | POA: Diagnosis not present

## 2021-10-06 DIAGNOSIS — E785 Hyperlipidemia, unspecified: Secondary | ICD-10-CM | POA: Diagnosis not present

## 2021-10-06 MED ORDER — METOPROLOL SUCCINATE ER 25 MG PO TB24: 25.0000 mg | ORAL_TABLET | Freq: Every day | ORAL | 3 refills | Status: DC

## 2021-10-06 NOTE — Progress Notes (Signed)
Cardiology Office Note:    Date:  10/06/2021   ID:  Kellie Roberson, DOB 1945-06-17, MRN [id number]  PCP:  Kellie Mc, MD  Cardiologist:  Kellie Sable, MD  Electrophysiologist:  None   Referring MD: Kellie Mc, MD   Chief Complaint  Patient presents with   OTher    12 month follow up -- Meds reviewed verbally with patient.     History of Present Illness:    Kellie Roberson is a 76 y.o. female with a hx of hypothyroidism, paroxysmal SVT, hyperlipidemia who presents for follow-up.    Denies any palpitations since starting Toprol-XL.  Feels well, she joined a [employer], trying to be very active and exercising.  She feels well, has no concerns at this time.   Prior notes Transthoracic echo 12/2018 showed normal systolic and diastolic function, EF 55 to 60%.   Coronary CTA 01/2019 had a calcium score of 44, minimal nonobstructive CAD in the distal left main/ostial LAD 0 to 24%.  Cardiac monitor 01/2019 paroxysmal SVT. Statin therapy recommended, patient declined.  Past Medical History:  Diagnosis Date   Cancer ([place]) 09/22/2018   skin ca-basal cell   Thyroid disease     Past Surgical History:  Procedure Laterality Date   ABDOMINAL HYSTERECTOMY  2003   total-had some structural damage after 2nd pregnancy   THYROIDECTOMY  1975   was hyperthyroid    Current Medications: Current Meds  Medication Sig   Ascorbic Acid (VITAMIN C) 1000 MG tablet Take 1,000 mg by mouth daily.   aspirin 81 MG EC tablet Take 1 tablet (81 mg total) by mouth daily. Swallow whole.   estradiol (CLIMARA - DOSED IN MG/24 HR) 0.025 mg/24hr patch USE AS DIRECTED   meloxicam (MOBIC) 15 MG tablet TAKE 1 TABLET BY MOUTH EVERY DAY AS NEEDED FOR PAIN   Multiple Vitamin (MULTIVITAMIN WITH MINERALS) TABS tablet Take 1 tablet by mouth daily.   Omega-3 Fatty Acids (FISH OIL) 1000 MG CAPS Take 1,000 mg by mouth daily.   Probiotic Product (PROBIOTIC-10 PO) Take by mouth daily.   SYNTHROID 100 MCG tablet  TAKE 1 TABLET (100 MCG TOTAL) BY MOUTH DAILY.   [DISCONTINUED] metoprolol succinate (TOPROL XL) 25 MG 24 hr tablet Take 1 tablet (25 mg total) by mouth daily.     Allergies:   Patient has no known allergies.   Social History   Socioeconomic History   Marital status: Married    Spouse name: Not on file   Number of children: Not on file   Years of education: Not on file   Highest education level: Not on file  Occupational History   Not on file  Tobacco Use   Smoking status: Never   Smokeless tobacco: Never  Vaping Use   Vaping Use: Never used  Substance and Sexual Activity   Alcohol use: Yes    Alcohol/week: 10.0 standard drinks of alcohol    Types: 10 Glasses of wine per week   Drug use: No   Sexual activity: Not on file  Other Topics Concern   Not on file  Social History Narrative   Not on file   Social Determinants of Health   Financial Resource Strain: Low Risk  (10/12/2020)   Overall Financial Resource Strain (CARDIA)    Difficulty of Paying Living Expenses: Not hard at all  Food Insecurity: No Food Insecurity (10/12/2020)   Hunger Vital Sign    Worried About Running Out of Food in the Last Year:  Never true    Ran Out of Food in the Last Year: Never true  Transportation Needs: No Transportation Needs (10/12/2020)   PRAPARE - Hydrologist (Medical): No    Lack of Transportation (Non-Medical): No  Physical Activity: Sufficiently Active (10/12/2020)   Exercise Vital Sign    Days of Exercise per Week: 5 days    Minutes of Exercise per Session: 60 min  Stress: No Stress Concern Present (10/12/2020)   [place]    Feeling of Stress : Not at all  Social Connections: Unknown (10/12/2020)   Social Connection and Isolation Panel [NHANES]    Frequency of Communication with Friends and Family: More than three times a week    Frequency of Social Gatherings with Friends and Family:  More than three times a week    Attends Religious Services: Not on Advertising copywriter or Organizations: Not on file    Attends Archivist Meetings: Not on file    Marital Status: Married     Family History: The patient's family history includes Alzheimer's disease in her mother; Glaucoma in her father; Hypertension in her father; Mitral valve prolapse in her sister; Thyroid disease in her sister. There is no history of Breast cancer.  ROS:   Please see the history of present illness.     All other systems reviewed and are negative.  EKGs/Labs/Other Studies Reviewed:    The following studies were reviewed today:   EKG:  EKG is  ordered today.  The ekg ordered today demonstrates normal sinus rhythm  Recent Labs: 12/28/2020: TSH 1.01 01/25/2021: ALT 22; BUN 17; Creatinine, Ser 0.65; Potassium 4.0; Sodium 139  Recent Lipid Panel    Component Value Date/Time   CHOL 220 (H) 07/14/2020 0730   CHOL 229 (H) 07/09/2017 0732   TRIG 65 07/14/2020 0730   HDL 86 07/14/2020 0730   HDL 86 07/09/2017 0732   CHOLHDL 2.6 07/14/2020 0730   VLDL 13 07/14/2020 0730   LDLCALC 121 (H) 07/14/2020 0730   LDLCALC 128 (H) 07/09/2017 0732    Physical Exam:    VS:  BP 138/78 (BP Location: Left Arm, Patient Position: Sitting, Cuff Size: Normal)   Pulse 66   Ht '5\' 6"'$  (1.676 m)   Wt 157 lb (71.2 kg)   SpO2 97%   BMI 25.34 kg/m     Wt Readings from Last 3 Encounters:  10/06/21 157 lb (71.2 kg)  08/07/21 155 lb (70.3 kg)  12/28/20 155 lb 9.6 oz (70.6 kg)     GEN:  Well nourished, well developed in no acute distress HEENT: Normal NECK: No JVD; No carotid bruits CARDIAC: RRR, no murmurs, rubs, gallops RESPIRATORY:  Clear to auscultation without rales, wheezing or rhonchi  ABDOMEN: Soft, non-tender, non-distended MUSCULOSKELETAL:  No edema; No deformity  SKIN: Warm and dry NEUROLOGIC:  Alert and oriented x 3 PSYCHIATRIC:  Normal affect   ASSESSMENT:    1. Paroxysmal  SVT (supraventricular tachycardia) ([place])   2. Hyperlipidemia, unspecified hyperlipidemia type    PLAN:    Palpitations, paroxysmal SVT, PVCs, symptoms improved with Toprol-XL.  Continue Toprol-XL. Hyperlipidemia, continue low-cholesterol diet, patient declines statin therapy.  Follow-up in 1 year  Medication Adjustments/Labs and Tests Ordered: Current medicines are reviewed at length with the patient today.  Concerns regarding medicines are outlined above.  Orders Placed This Encounter  Procedures   EKG 12-Lead   Meds  ordered this encounter  Medications   metoprolol succinate (TOPROL XL) 25 MG 24 hr tablet    Sig: Take 1 tablet (25 mg total) by mouth daily.    Dispense:  90 tablet    Refill:  3    Patient Instructions  Medication Instructions:  Your physician recommends that you continue on your current medications as directed. Please refer to the Current Medication list given to you today.  *If you need a refill on your cardiac medications before your next appointment, please call your pharmacy*   Follow-Up: At [facility], you and your health needs are our priority.  As part of our continuing mission to provide you with exceptional heart care, we have created designated Provider Care Teams.  These Care Teams include your primary Cardiologist (physician) and Advanced Practice Providers (APPs -  Physician Assistants and Nurse Practitioners) who all work together to provide you with the care you need, when you need it.  We recommend signing up for the patient portal called "MyChart".  Sign up information is provided on this After Visit Summary.  MyChart is used to connect with patients for Virtual Visits (Telemedicine).  Patients are able to view lab/test results, encounter notes, upcoming appointments, etc.  Non-urgent messages can be sent to your provider as well.   To learn more about what you can do with MyChart, go to [email or web address].    Your next appointment:   1  year(s)  The format for your next appointment:   In Person  Provider:   Kate Sable, MD    Other Instructions   Important Information About Sugar         Signed, Kellie Sable, MD  10/06/2021 10:50 AM    [place]

## 2021-10-06 NOTE — Patient Instructions (Signed)
Medication Instructions:   Your physician recommends that you continue on your current medications as directed. Please refer to the Current Medication list given to you today.   *If you need a refill on your cardiac medications before your next appointment, please call your pharmacy*  Follow-Up: At [facility], you and your health needs are our priority.  As part of our continuing mission to provide you with exceptional heart care, we have created designated Provider Care Teams.  These Care Teams include your primary Cardiologist (physician) and Advanced Practice Providers (APPs -  Physician Assistants and Nurse Practitioners) who all work together to provide you with the care you need, when you need it.  We recommend signing up for the patient portal called "MyChart".  Sign up information is provided on this After Visit Summary.  MyChart is used to connect with patients for Virtual Visits (Telemedicine).  Patients are able to view lab/test results, encounter notes, upcoming appointments, etc.  Non-urgent messages can be sent to your provider as well.   To learn more about what you can do with MyChart, go to [email or web address].    Your next appointment:   1 year(s)  The format for your next appointment:   In Person  Provider:   Brian Agbor-Etang, MD    Other Instructions   Important Information About Sugar       

## 2021-10-13 ENCOUNTER — Ambulatory Visit (INDEPENDENT_AMBULATORY_CARE_PROVIDER_SITE_OTHER): Payer: Medicare HMO

## 2021-10-13 VITALS — Ht 66.0 in | Wt 157.0 lb

## 2021-10-13 DIAGNOSIS — Z0000 Encounter for general adult medical examination without abnormal findings: Secondary | ICD-10-CM

## 2021-10-13 NOTE — Patient Instructions (Addendum)
  Ms. Oneil , Thank you for taking time to come for your Medicare Wellness Visit. I appreciate your ongoing commitment to your health goals. Please review the following plan we discussed and let me know if I can assist you in the future.   These are the goals we discussed:  Goals      Follow up with Primary Care Provider     As needed.        This is a list of the screening recommended for you and due dates:  Health Maintenance  Topic Date Due   Flu Shot  10/03/2021   COVID-19 Vaccine (7 - Moderna risk series) 10/29/2021*   Hepatitis C Screening: USPSTF Recommendation to screen - Ages 18-79 yo.  11/03/2021*   Zoster (Shingles) Vaccine (2 of 2) 11/03/2021*   Cologuard (Stool DNA test)  11/30/2022   Mammogram  03/09/2023   Tetanus Vaccine  03/14/2031   Pneumonia Vaccine  Completed   DEXA scan (bone density measurement)  Completed   HPV Vaccine  Aged Out   Colon Cancer Screening  Discontinued  *Topic was postponed. The date shown is not the original due date.

## 2021-10-13 NOTE — Progress Notes (Signed)
Subjective:   Kellie Roberson is a 76 y.o. female who presents for Medicare Annual (Subsequent) preventive examination.  Review of Systems    No ROS.  Medicare Wellness Virtual Visit.  Visual/audio telehealth visit, UTA vital signs.   See social history for additional risk factors.   Cardiac Risk Factors include: advanced age (>15mn, >>54women)     Objective:    Today's Vitals   10/13/21 1446  Weight: 157 lb (71.2 kg)  Height: '5\' 6"'$  (1.676 m)   Body mass index is 25.34 kg/m.     10/13/2021    2:47 PM 11/09/2020    8:27 AM 10/12/2020    3:14 PM 02/01/2020    1:05 PM 10/12/2019    2:07 PM 12/20/2018    9:38 AM 10/06/2018    1:20 PM  Advanced Directives  Does Patient Have a Medical Advance Directive? Yes Yes Yes No Yes No Yes  Type of [place]  [place]  Living will;Healthcare Power of Attorney  Does patient want to make changes to medical advance directive? No - Patient declined No - Patient declined No - Patient declined  No - Patient declined  No - Patient declined  Copy of HOrwellin Chart? No - copy requested No - copy requested No - copy requested  No - copy requested  No - copy requested    Current Medications (verified) Outpatient Encounter Medications as of 10/13/2021  Medication Sig   Ascorbic Acid (VITAMIN C) 1000 MG tablet Take 1,000 mg by mouth daily.   aspirin 81 MG EC tablet Take 1 tablet (81 mg total) by mouth daily. Swallow whole.   estradiol (CLIMARA - DOSED IN MG/24 HR) 0.025 mg/24hr patch USE AS DIRECTED   meloxicam (MOBIC) 15 MG tablet TAKE 1 TABLET BY MOUTH EVERY DAY AS NEEDED FOR PAIN   metoprolol succinate (TOPROL XL) 25 MG 24 hr tablet Take 1 tablet (25 mg total) by mouth daily.   Multiple Vitamin (MULTIVITAMIN WITH MINERALS) TABS tablet Take 1 tablet by mouth daily.    Omega-3 Fatty Acids (FISH OIL) 1000 MG CAPS Take 1,000 mg by mouth daily.   Probiotic Product (PROBIOTIC-10 PO) Take by mouth daily.   SYNTHROID 100 MCG tablet TAKE 1 TABLET (100 MCG TOTAL) BY MOUTH DAILY.   No facility-administered encounter medications on file as of 10/13/2021.    Allergies (verified) Patient has no known allergies.   History: Past Medical History:  Diagnosis Date   [place]   skin ca-basal cell   Thyroid disease    Past Surgical History:  Procedure Laterality Date   ABDOMINAL HYSTERECTOMY  2003   total-had some structural damage after 2nd pregnancy   THYROIDECTOMY  1975   was hyperthyroid   Family History  Problem Relation Age of Onset   Alzheimer's disease Mother    Hypertension Father    Glaucoma Father    Thyroid disease Sister    Mitral valve prolapse Sister    Breast cancer Neg Hx    Social History   Socioeconomic History   Marital status: Married    Spouse name: Not on file   Number of children: Not on file   Years of education: Not on file   Highest education level: Not on file  Occupational History   Not on file  Tobacco Use   Smoking status: Never  Smokeless tobacco: Never  Vaping Use   Vaping Use: Never used  Substance and Sexual Activity   Alcohol use: Yes    Alcohol/week: 10.0 standard drinks of alcohol    Types: 10 Glasses of wine per week   Drug use: No   Sexual activity: Not on file  Other Topics Concern   Not on file  Social History Narrative   Not on file   Social Determinants of Health   Financial Resource Strain: Low Risk  (10/13/2021)   Overall Financial Resource Strain (CARDIA)    Difficulty of Paying Living Expenses: Not hard at all  Food Insecurity: No Food Insecurity (10/13/2021)   Hunger Vital Sign    Worried About Running Out of Food in the Last Year: Never true    Ran Out of Food in the Last Year: Never true  Transportation Needs: No Transportation Needs (10/13/2021)   PRAPARE -  Hydrologist (Medical): No    Lack of Transportation (Non-Medical): No  Physical Activity: Sufficiently Active (10/13/2021)   Exercise Vital Sign    Days of Exercise per Week: 5 days    Minutes of Exercise per Session: 60 min  Stress: No Stress Concern Present (10/13/2021)   [place]    Feeling of Stress : Not at all  Social Connections: Unknown (10/13/2021)   Social Connection and Isolation Panel [NHANES]    Frequency of Communication with Friends and Family: More than three times a week    Frequency of Social Gatherings with Friends and Family: More than three times a week    Attends Religious Services: Not on Advertising copywriter or Organizations: Not on file    Attends Archivist Meetings: Not on file    Marital Status: Married    Tobacco Counseling Counseling given: Not Answered  Clinical Intake: Pre-visit preparation completed: Yes        Diabetes: No  How often do you need to have someone help you when you read instructions, pamphlets, or other written materials from your doctor or pharmacy?: 1 - Never   Interpreter Needed?: No    Activities of Daily Living    10/13/2021    2:49 PM  In your present state of health, do you have any difficulty performing the following activities:  Hearing? 0  Vision? 0  Difficulty concentrating or making decisions? 0  Walking or climbing stairs? 0  Dressing or bathing? 0  Doing errands, shopping? 0  Preparing Food and eating ? N  Using the Toilet? N  In the past six months, have you accidently leaked urine? N  Do you have problems with loss of bowel control? N  Managing your Medications? N  Managing your Finances? N  Housekeeping or managing your Housekeeping? N   Patient Care Team: [name], MD as PCP - General (Internal Medicine) Kate Sable, MD as PCP - Cardiology (Cardiology)  Indicate any  recent Medical Services you may have received from other than [facility] providers in the past year (date may be approximate).     Assessment:   This is a routine wellness examination for Kellie Roberson.  Virtual Visit via Telephone Note  I connected with  Kellie Roberson on 10/13/21 at  2:45 PM EDT by telephone and verified that I am speaking with the correct person using two identifiers.  Location: Patient: home Provider: office Persons participating in the virtual visit: patient/Nurse Health  Advisor   I discussed the limitations of performing an evaluation and management service by telehealth. We continued and completed visit with audio only. Some vital signs may be absent or patient reported.   Hearing/Vision screen Hearing Screening - Comments:: Patient is able to hear conversational tones without difficulty. No issues reported. Vision Screening - Comments:: Followed by Dr. Ellin Mayhew  Wears corrective lenses They have seen their ophthalmologist in the last 12 months.   Dietary issues and exercise activities discussed: Current Exercise Habits: Home exercise routine, Type of exercise: strength training/weights;calisthenics;stretching (Floor exercises), Time (Minutes): 30, Frequency (Times/Week): 5, Weekly Exercise (Minutes/Week): 150, Intensity: Mild Healthy diet Good water intake   Goals Addressed             This Visit's Progress    Follow up with Primary Care Provider       As needed.       Depression Screen    10/13/2021    2:49 PM 12/28/2020    1:42 PM 11/09/2020    8:29 AM 10/12/2020    2:39 PM 10/12/2019    1:45 PM 10/06/2018    1:17 PM 07/25/2018    8:09 AM  PHQ 2/9 Scores  PHQ - 2 Score 0 0 0 0 0 0 0  PHQ- 9 Score       0    Fall Risk    10/13/2021    2:49 PM 12/28/2020    1:42 PM 11/09/2020    8:28 AM 10/12/2020    3:15 PM 01/06/2020    8:41 AM  [place] in the past year? 0 0 0 0 0  Number falls in past yr:    0   Injury with Fall?    0   Risk for fall due to  :  No Fall Risks     Follow up Falls evaluation completed Falls evaluation completed  Falls evaluation completed Falls evaluation completed    [place]: Home free of loose throw rugs in walkways, pet beds, electrical cords, etc? Yes  Adequate lighting in your home to reduce risk of falls? Yes   ASSISTIVE DEVICES UTILIZED TO PREVENT FALLS: Use of a cane, walker or w/c? No   TIMED UP AND GO: Was the test performed? No .   Cognitive Function: Patient is alert and oriented x3.  Denies difficulty focusing, making decisions, memory loss.       10/12/2019    2:08 PM  MMSE - Mini Mental State Exam  Not completed: Unable to complete        10/06/2018    1:36 PM  6CIT Screen  What Year? 0 points  What month? 0 points  What time? 0 points  Count back from 20 0 points  Months in reverse 0 points  Repeat phrase 0 points  Total Score 0 points    Immunizations Immunization History  Administered Date(s) Administered   Fluad Quad(high Dose 65+) 11/15/2018   Influenza, High Dose Seasonal PF 11/25/2019, 12/30/2020   Moderna Sars-Covid-2 Vaccination 04/06/2019, 05/04/2019, 12/30/2019, 06/25/2020   Pfizer Covid-19 Vaccine Bivalent Booster 76yr & up 11/23/2020, 07/17/2021   Pneumococcal Conjugate-13 11/04/2015   Pneumococcal Polysaccharide-23 07/11/2017   Td 03/05/2010   Tdap 03/13/2021   Zoster Recombinat (Shingrix) 03/31/2021   Screening Tests Health Maintenance  Topic Date Due   INFLUENZA VACCINE  10/03/2021   COVID-19 Vaccine (7 - Moderna risk series) 10/29/2021 (Originally 09/11/2021)   Hepatitis C Screening  11/03/2021 (  Originally 07/03/1963)   Zoster Vaccines- Shingrix (2 of 2) 11/03/2021 (Originally 05/26/2021)   Fecal DNA (Cologuard)  11/30/2022   MAMMOGRAM  03/09/2023   TETANUS/TDAP  03/14/2031   Pneumonia Vaccine 34+ Years old  Completed   DEXA SCAN  Completed   HPV VACCINES  Aged Out   COLONOSCOPY (Pts 45-75yr Insurance coverage will  need to be confirmed)  Discontinued   Health Maintenance Health Maintenance Due  Topic Date Due   INFLUENZA VACCINE  10/03/2021   Shingrix vaccine- agrees to update immunization record when second dose is complete.   Lung Cancer Screening: (Low Dose CT Chest recommended if Age 45-80years, 30 pack-year currently smoking OR have quit w/in 15years.) does not qualify.   Vision Screening: Recommended annual ophthalmology exams for early detection of glaucoma and other disorders of the eye.  Dental Screening: Recommended annual dental exams for proper oral hygiene  Community Resource Referral / Chronic Care Management: CRR required this visit?  No   CCM required this visit?  No      Plan:   Keep all routine maintenance appointments.   I have personally reviewed and noted the following in the patient's chart:   Medical and social history Use of alcohol, tobacco or illicit drugs  Current medications and supplements including opioid prescriptions.  Functional ability and status Nutritional status Physical activity Advanced directives List of other physicians Hospitalizations, surgeries, and ER visits in previous 12 months Vitals Screenings to include cognitive, depression, and falls Referrals and appointments  In addition, I have reviewed and discussed with patient certain preventive protocols, quality metrics, and best practice recommendations. A written personalized care plan for preventive services as well as general preventive health recommendations were provided to patient.     OVarney Biles LPN   [id number]

## 2021-10-31 ENCOUNTER — Other Ambulatory Visit: Payer: Self-pay | Admitting: Internal Medicine

## 2021-12-02 ENCOUNTER — Other Ambulatory Visit: Payer: Self-pay | Admitting: Internal Medicine

## 2021-12-05 ENCOUNTER — Encounter: Payer: Self-pay | Admitting: Family Medicine

## 2021-12-05 ENCOUNTER — Telehealth (INDEPENDENT_AMBULATORY_CARE_PROVIDER_SITE_OTHER): Payer: Medicare HMO | Admitting: Family Medicine

## 2021-12-05 VITALS — Ht 66.0 in | Wt 157.0 lb

## 2021-12-05 DIAGNOSIS — U071 COVID-19: Secondary | ICD-10-CM | POA: Diagnosis not present

## 2021-12-05 MED ORDER — MOLNUPIRAVIR EUA 200MG CAPSULE: 4.0000 | ORAL_CAPSULE | Freq: Two times a day (BID) | ORAL | 0 refills | Status: AC

## 2021-12-05 NOTE — Patient Instructions (Signed)
It was a pleasure meeting you today. Thank you for allowing me to take part in your health care.  Our goals for today as we discussed include:  Start Molnupiravir 800 mg twice a day  Symptomatic management for fever, muscle aches and headaches  -Tylenol 325-500 mg every 6 hours as needed -Ibuprofen 200 mg every 8 hours as needed  Stay well hydrated Rest as needed with frequent repositioning and ambulation.  Increase activity as soon as tolerated to help with recovery.  Continue wearing masks, hand washing and self isolation until symptom free.  If you have worsening symptoms, especially difficulty breathing please call 911 or have someone take you to the emergency department.    Please follow-up with PCP as scheduled or sooner if needed  If you have any questions or concerns, please do not hesitate to call the office at [phone].  I look forward to our next visit and until then take care and stay safe.  Regards,   Carollee Leitz, MD   [place]   Molnupiravir Capsules What is this medication? MOLNUPIRAVIR (MOL nue PIR a vir) treats mild to moderate COVID-19. It may help people who are at high risk of developing severe illness. This medication works by limiting the spread of the virus in your body. The FDA has allowed the emergency use of this medication. This medicine may be used for other purposes; ask your health care provider or pharmacist if you have questions. COMMON BRAND NAME(S): LAGEVRIO What should I tell my care team before I take this medication? They need to know if you have any of these conditions: Any allergies Any serious illness An unusual or allergic reaction to molnupiravir, other medications, foods, dyes, or preservatives Pregnant or trying to get pregnant Breast-feeding How should I use this medication? Take this medication by mouth with water. Take it as directed on the prescription label at the same time every day. Do not cut, crush,  or chew this medication. Swallow the capsules whole. You can take it with or without food. If it upsets your stomach, take it with food. Take all of it unless your care team tells you to stop it early. Keep taking it even if you think you are better. Talk to your care team about the use of this medication in children. Special care may be needed. Overdosage: If you think you have taken too much of this medicine contact a poison control center or emergency room at once. NOTE: This medicine is only for you. Do not share this medicine with others. What if I miss a dose? If you miss a dose, take it as soon as you can unless it is more than 10 hours late. If it is more than 10 hours late, skip the missed dose. Take the next dose at the normal time. Do not take extra or 2 doses at the same time to make up for the missed dose. What may interact with this medication? Interactions have not been studied. This list may not describe all possible interactions. Give your health care provider a list of all the medicines, herbs, non-prescription drugs, or dietary supplements you use. Also tell them if you smoke, drink alcohol, or use illegal drugs. Some items may interact with your medicine. What should I watch for while using this medication? Your condition will be monitored carefully while you are receiving this medication. Visit your care team for regular checkups. Tell your care team if your symptoms do not start to get  better or if they get worse. Do not become pregnant while taking this medication. You may need a pregnancy test before starting this medication. Women must use a reliable form of birth control while taking this medication and for 4 days after stopping the medication. Women should inform their care team if they wish to become pregnant or think they might be pregnant. Men should not father a child while taking this medication and for 3 months after stopping it. There is potential for serious harm to an  unborn child. Talk to your care team for more information. Do not breast-feed an infant while taking this medication and for 4 days after stopping the medication. What side effects may I notice from receiving this medication? Side effects that you should report to your care team as soon as possible: Allergic reactions--skin rash, itching, hives, swelling of the face, lips, tongue, or throat Side effects that usually do not require medical attention (report these to your care team if they continue or are bothersome): Diarrhea Dizziness Nausea This list may not describe all possible side effects. Call your doctor for medical advice about side effects. You may report side effects to FDA at 1-800-FDA-1088. Where should I keep my medication? Keep out of the reach of children and pets. Store at room temperature between 20 and 25 degrees C (68 and 77 degrees F). Get rid of any unused medication after the expiration date. To get rid of medications that are no longer needed or have expired: Take the medication to a medication take-back program. Check with your pharmacy or law enforcement to find a location. If you cannot return the medication, check the label or package insert to see if the medication should be thrown out in the garbage or flushed down the toilet. If you are not sure, ask your care team. If it is safe to put it in the trash, take the medication out of the container. Mix the medication with cat litter, dirt, coffee grounds, or other unwanted substance. Seal the mixture in a bag or container. Put it in the trash. NOTE: This sheet is a summary. It may not cover all possible information. If you have questions about this medicine, talk to your doctor, pharmacist, or health care provider.  2023 Elsevier/Gold Standard (2020-02-29 00:00:00)

## 2021-12-05 NOTE — Progress Notes (Signed)
[facility] Telemedicine Visit  Patient consented to have virtual visit and was identified by name and date of birth. Method of visit: Video  Encounter participants: Patient: Kellie Roberson - located at home Provider: Carollee Leitz - located at office Others (if applicable): none  Chief Complaint: COVID symptoms  HPI:  Primary symptom: dry cough Duration:1 day Severity:mild Associated symptoms: stuffy nose, sore throat, chills and body aches Fever? Tmax?: Subjective fevers.   Sick contacts:Unknown Covid test:Home test positive Covid vaccination(s):yes Alternating Tylenol and ibuprofen as needed Hydrating well.  No decrease in appetite. Denies any chest pain, shortness of breath, difficulty breathing or lower extremity edema. Recent 13 hr travel from [place] 3 days ago.  ROS: per HPI  Pertinent PMHx:  HTN  Exam:  Ht '5\' 6"'$  (1.676 m)   Wt 157 lb (71.2 kg)   BMI 25.34 kg/m   Respiratory: Speaking in full sentences.  No increased work of breathing, shortness of breath or cough during video visit  Assessment/Plan:  Positive self-administered antigen test for COVID-19 Positive home COVID test.  In no acute respiratory distress. Wells score for PE 0, given recent travel.  Limited exam given nature of video visit. -Start Molnupiravir 800 mg twice a day x 5 days -Symptomatic management for fever, muscle aches and headaches  -Tylenol 325-500 mg every 6 hours as needed -Ibuprofen 200 mg every 8 hours as needed -Increase hydration -COVID precautions provided -Strict ED/return precautions provided -Follow up with PCP as scheduled    Time spent during visit with patient: 20 minutes

## 2021-12-09 ENCOUNTER — Encounter: Payer: Self-pay | Admitting: Family Medicine

## 2021-12-09 DIAGNOSIS — U071 COVID-19: Secondary | ICD-10-CM | POA: Insufficient documentation

## 2021-12-09 NOTE — Assessment & Plan Note (Signed)
Positive home COVID test.  In no acute respiratory distress. Wells score for PE 0, given recent travel.  Limited exam given nature of video visit. -Start Molnupiravir 800 mg twice a day x 5 days -Symptomatic management for fever, muscle aches and headaches  -Tylenol 325-500 mg every 6 hours as needed -Ibuprofen 200 mg every 8 hours as needed -Increase hydration -COVID precautions provided -Strict ED/return precautions provided -Follow up with PCP as scheduled

## 2021-12-28 ENCOUNTER — Encounter: Payer: Self-pay | Admitting: Internal Medicine

## 2021-12-29 ENCOUNTER — Other Ambulatory Visit: Payer: Self-pay | Admitting: Internal Medicine

## 2021-12-29 ENCOUNTER — Other Ambulatory Visit: Payer: Self-pay

## 2021-12-29 DIAGNOSIS — E890 Postprocedural hypothyroidism: Secondary | ICD-10-CM

## 2021-12-29 DIAGNOSIS — E782 Mixed hyperlipidemia: Secondary | ICD-10-CM

## 2021-12-30 ENCOUNTER — Other Ambulatory Visit
Admission: RE | Admit: 2021-12-30 | Discharge: 2021-12-30 | Disposition: A | Discharge status: Home / Self Care | Payer: Medicare HMO | Attending: Internal Medicine | Admitting: Internal Medicine

## 2021-12-30 DIAGNOSIS — E890 Postprocedural hypothyroidism: Secondary | ICD-10-CM | POA: Diagnosis not present

## 2021-12-30 DIAGNOSIS — E782 Mixed hyperlipidemia: Secondary | ICD-10-CM | POA: Insufficient documentation

## 2021-12-30 LAB — COMPREHENSIVE METABOLIC PANEL
ALT: 25 U/L (ref 0–44)
AST: 23 U/L (ref 15–41)
Albumin: 3.8 g/dL (ref 3.5–5.0)
Alkaline Phosphatase: 69 U/L (ref 38–126)
Anion gap: 4 — ABNORMAL LOW (ref 5–15)
BUN: 17 mg/dL (ref 8–23)
CO2: 27 mmol/L (ref 22–32)
Calcium: 8.8 mg/dL — ABNORMAL LOW (ref 8.9–10.3)
Chloride: 107 mmol/L (ref 98–111)
Creatinine, Ser: 0.68 mg/dL (ref 0.44–1.00)
GFR, Estimated: >60 mL/min (ref >60)
Glucose, Bld: 96 mg/dL (ref 70–99)
Potassium: 4 mmol/L (ref 3.5–5.1)
Sodium: 138 mmol/L (ref 135–145)
Total Bilirubin: 1.3 mg/dL — ABNORMAL HIGH (ref 0.3–1.2)
Total Protein: 7 g/dL (ref 6.5–8.1)

## 2021-12-30 LAB — LIPID PANEL
Cholesterol: 212 mg/dL — ABNORMAL HIGH (ref 0–200)
HDL: 73 mg/dL (ref >40)
LDL Cholesterol: 126 mg/dL — ABNORMAL HIGH (ref 0–99)
Total CHOL/HDL Ratio: 2.9 RATIO
Triglycerides: 64 mg/dL (ref <150)
VLDL: 13 mg/dL (ref 0–40)

## 2021-12-30 LAB — TSH: TSH: 0.68 u[IU]/mL (ref 0.350–4.500)

## 2022-01-01 ENCOUNTER — Other Ambulatory Visit: Payer: Self-pay | Admitting: Internal Medicine

## 2022-01-01 ENCOUNTER — Ambulatory Visit (INDEPENDENT_AMBULATORY_CARE_PROVIDER_SITE_OTHER): Payer: Medicare HMO | Admitting: Internal Medicine

## 2022-01-01 ENCOUNTER — Encounter: Payer: Self-pay | Admitting: Internal Medicine

## 2022-01-01 VITALS — BP 122/82 | HR 67 | Temp 97.6°F | Resp 12 | Ht 66.0 in | Wt 157.0 lb

## 2022-01-01 DIAGNOSIS — Z23 Encounter for immunization: Secondary | ICD-10-CM

## 2022-01-01 DIAGNOSIS — T466X5A Adverse effect of antihyperlipidemic and antiarteriosclerotic drugs, initial encounter: Secondary | ICD-10-CM | POA: Diagnosis not present

## 2022-01-01 DIAGNOSIS — R030 Elevated blood-pressure reading, without diagnosis of hypertension: Secondary | ICD-10-CM | POA: Diagnosis not present

## 2022-01-01 DIAGNOSIS — Z1231 Encounter for screening mammogram for malignant neoplasm of breast: Secondary | ICD-10-CM | POA: Diagnosis not present

## 2022-01-01 DIAGNOSIS — I2584 Coronary atherosclerosis due to calcified coronary lesion: Secondary | ICD-10-CM

## 2022-01-01 DIAGNOSIS — M791 Myalgia, unspecified site: Secondary | ICD-10-CM | POA: Diagnosis not present

## 2022-01-01 DIAGNOSIS — I700 Atherosclerosis of aorta: Secondary | ICD-10-CM

## 2022-01-01 DIAGNOSIS — Z0000 Encounter for general adult medical examination without abnormal findings: Secondary | ICD-10-CM | POA: Diagnosis not present

## 2022-01-01 DIAGNOSIS — E890 Postprocedural hypothyroidism: Secondary | ICD-10-CM

## 2022-01-01 DIAGNOSIS — I493 Ventricular premature depolarization: Secondary | ICD-10-CM

## 2022-01-01 LAB — MICROALBUMIN / CREATININE URINE RATIO
Creatinine,U: 46.3 mg/dL
Microalb Creat Ratio: 1.5 mg/g (ref 0.0–30.0)
Microalb, Ur: <0.7 mg/dL (ref 0.0–1.9)

## 2022-01-01 MED ORDER — SYNTHROID 100 MCG PO TABS: ORAL_TABLET | ORAL | 1 refills | Status: DC

## 2022-01-01 NOTE — Assessment & Plan Note (Signed)

## 2022-01-01 NOTE — Assessment & Plan Note (Addendum)
Home machine is readings are 10 pts higher .  HER READING today in the office is normal

## 2022-01-01 NOTE — Progress Notes (Signed)
Patient ID: Kellie Roberson, female    DOB: 1945-06-29  Age: 76 y.o. MRN: [id number]  The patient is here for annual preventive examination and management of other chronic and acute problems.   The risk factors are reflected in the social history.  The roster of all physicians providing medical care to patient - is listed in the Snapshot section of the chart.  Activities of daily living:  The patient is 100% independent in all ADLs: dressing, toileting, feeding as well as independent mobility  Home safety : The patient has smoke detectors in the home. They wear seatbelts.  There are no firearms at home. There is no violence in the home.   There is no risks for hepatitis, STDs or HIV. There is no   history of blood transfusion. They have no travel history to infectious disease endemic areas of the world.  The patient has seen her dentist in the last six month.  She has seen their eye doctor in the last year. She denies  hearing difficulty with regard to whispered voices and some television programs.  They have deferred audiologic testing in the last year.  They do not  have excessive sun exposure. Discussed the need for sun protection: hats, long sleeves and use of sunscreen if there is significant sun exposure.   Diet: the importance of a healthy diet is discussed. She does  have a healthy diet.  The benefits of regular aerobic exercise were discussed. She is physically very active and walks  7 times per week ,  30 minutes.   Depression screen: there are no signs or vegative symptoms of depression- irritability, change in appetite, anhedonia, sadness/tearfullness.  Cognitive assessment: the patient manages all their financial and personal affairs and is actively engaged. They could relate day,date,year and events; recalled 2/3 objects at 3 minutes; performed clock-face test normally.  The following portions of the patient's history were reviewed and updated as appropriate: allergies, current  medications, past family history, past medical history,  past surgical history, past social history  and problem list.  Visual acuity was not assessed per patient preference since she has regular follow up with her ophthalmologist. Hearing and body mass index were assessed and reviewed.   During the course of the visit the patient was educated and counseled about appropriate screening and preventive services including : fall prevention , diabetes screening, nutrition counseling, colorectal cancer screening, and recommended immunizations.    CC: The primary encounter diagnosis was Breast cancer screening by mammogram. Diagnoses of Myalgia due to statin, White coat syndrome with high blood pressure but without hypertension, Thoracic aortic atherosclerosis ([place]), Postoperative hypothyroidism, PVC (premature ventricular contraction), Coronary atherosclerosis due to calcified coronary lesion (CODE), Need for immunization against influenza, and Encounter for preventive health examination were also pertinent to this visit.  History Kellie Roberson has a past medical history of Cancer ([place]) (09/22/2018) and Thyroid disease.   She has a past surgical history that includes Abdominal hysterectomy (2003) and Thyroidectomy (1975).   Her family history includes Alzheimer's disease in her mother; Glaucoma in her father; Hypertension in her father; Mitral valve prolapse in her sister; Thyroid disease in her sister.She reports that she has never smoked. She has never used smokeless tobacco. She reports current alcohol use of about 10.0 standard drinks of alcohol per week. She reports that she does not use drugs.  Outpatient Medications Prior to Visit  Medication Sig Dispense Refill   Ascorbic Acid (VITAMIN C) 1000 MG tablet Take 1,000 mg by  mouth daily.     aspirin 81 MG EC tablet Take 1 tablet (81 mg total) by mouth daily. Swallow whole. 30 tablet 12   estradiol (CLIMARA - DOSED IN MG/24 HR) 0.025 mg/24hr patch USE AS  DIRECTED 4 patch 0   famotidine (PEPCID) 20 MG tablet Take 20 mg by mouth daily before supper.     meloxicam (MOBIC) 15 MG tablet TAKE 1 TABLET BY MOUTH EVERY DAY AS NEEDED FOR PAIN 90 tablet 0   metoprolol succinate (TOPROL XL) 25 MG 24 hr tablet Take 1 tablet (25 mg total) by mouth daily. 90 tablet 3   Multiple Vitamin (MULTIVITAMIN WITH MINERALS) TABS tablet Take 1 tablet by mouth daily.     Omega-3 Fatty Acids (FISH OIL) 1000 MG CAPS Take 1,000 mg by mouth daily.     Probiotic Product (PROBIOTIC-10 PO) Take by mouth daily.     SYNTHROID 100 MCG tablet TAKE 1 TABLET (100 MCG TOTAL) BY MOUTH DAILY. 90 tablet 1   No facility-administered medications prior to visit.    Review of Systems  Patient denies headache, fevers, malaise, unintentional weight loss, skin rash, eye pain, sinus congestion and sinus pain, sore throat, dysphagia,  hemoptysis , cough, dyspnea, wheezing, chest pain, palpitations, orthopnea, edema, abdominal pain, nausea, melena, diarrhea, constipation, flank pain, dysuria, hematuria, urinary  Frequency, nocturia, numbness, tingling, seizures,  Focal weakness, Loss of consciousness,  Tremor, insomnia, depression, anxiety, and suicidal ideation.     Objective:  BP 122/82   Pulse 67   Temp 97.6 F (36.4 C)   Resp 12   Ht '5\' 6"'$  (1.676 m)   Wt 157 lb (71.2 kg)   SpO2 98%   BMI 25.34 kg/m   Physical Exam  General appearance: alert, cooperative and appears stated age Head: Normocephalic, without obvious abnormality, atraumatic Eyes: conjunctivae/corneas clear. PERRL, EOM's intact. Fundi benign. Ears: normal TM's and external ear canals both ears Nose: Nares normal. Septum midline. Mucosa normal. No drainage or sinus tenderness. Throat: lips, mucosa, and tongue normal; teeth and gums normal Neck: no adenopathy, no carotid bruit, no JVD, supple, symmetrical, trachea midline and thyroid not enlarged, symmetric, no tenderness/mass/nodules Lungs: clear to auscultation  bilaterally Breasts: normal appearance, no masses or tenderness Heart: regular rate and rhythm, S1, S2 normal, no murmur, click, rub or gallop Abdomen: soft, non-tender; bowel sounds normal; no masses,  no organomegaly Extremities: extremities normal, atraumatic, no cyanosis or edema Pulses: 2+ and symmetric Skin: Skin color, texture, turgor normal. No rashes or lesions Neurologic: Alert and oriented X 3, normal strength and tone. Normal symmetric reflexes. Normal coordination and gait.    Assessment & Plan:   Problem List Items Addressed This Visit     Postoperative hypothyroidism    Thyroid function is WNL on current dose of NBO Synthroid 100 mcg daily .  No current changes needed.   Lab Results  Component Value Date   TSH 0.680 12/30/2021        Relevant Medications   SYNTHROID 100 MCG tablet   White coat syndrome with high blood pressure but without hypertension    Home machine is readings are 10 pts higher .  HER READING today in the office is normal      Relevant Orders   Urine Microalbumin w/creat. ratio (Completed)   PVC (premature ventricular contraction)    Managed with low dose metoprolol       Coronary atherosclerosis due to calcified coronary lesion (CODE)    She has no history of  exertional or rest chest pain and has had a trial of statin which caused myalgias       Encounter for preventive health examination    age appropriate education and counseling updated, referrals for preventative services and immunizations addressed, dietary and smoking counseling addressed, most recent labs reviewed.  I have personally reviewed and have noted:   1) the patient's medical and social history 2) The pt's use of alcohol, tobacco, and illicit drugs 3) The patient's current medications and supplements 4) Functional ability including ADL's, fall risk, home safety risk, hearing and visual impairment 5) Diet and physical activities 6) Evidence for depression or mood  disorder 7) The patient's height, weight, and BMI have been recorded in the chart  I have made referrals, and provided counseling and education based on review of the above      Thoracic aortic atherosclerosis ([place])    Noted on coronary calcium CT scan.  Reviewed again with patient today; statin therapy advised but deferred due previous history of myalgias with statin trial  Lab Results  Component Value Date   CHOL 212 (H) 12/30/2021   HDL 73 12/30/2021   LDLCALC 126 (H) 12/30/2021   TRIG 64 12/30/2021   CHOLHDL 2.9 12/30/2021         Myalgia due to statin    Trial of twice weekly statim cause myalgias      Other Visit Diagnoses     Breast cancer screening by mammogram    -  Primary   Relevant Orders   MM 3D SCREEN BREAST BILATERAL   Need for immunization against influenza       Relevant Orders   Flu Vaccine QUAD High Dose(Fluad) (Completed)      Meds ordered this encounter  Medications   SYNTHROID 100 MCG tablet    Sig: TAKE 1 TABLET (100 MCG TOTAL) BY MOUTH DAILY.    Dispense:  90 tablet    Refill:  1    Medications Discontinued During This Encounter  Medication Reason   SYNTHROID 100 MCG tablet Reorder    Follow-up: Return in about 6 months (around 07/03/2022).   [name], MD

## 2022-01-01 NOTE — Assessment & Plan Note (Signed)
Managed with low dose metoprolol

## 2022-01-01 NOTE — Assessment & Plan Note (Addendum)
Noted on coronary calcium CT scan.  Reviewed again with patient today; statin therapy advised but deferred due previous history of myalgias with statin trial  Lab Results  Component Value Date   CHOL 212 (H) 12/30/2021   HDL 73 12/30/2021   LDLCALC 126 (H) 12/30/2021   TRIG 64 12/30/2021   CHOLHDL 2.9 12/30/2021

## 2022-01-01 NOTE — Assessment & Plan Note (Signed)
Trial of twice weekly statim cause myalgias

## 2022-01-01 NOTE — Patient Instructions (Addendum)
For  joint pain :  You can take  2000 mg of acetominophen (tylenol) every day safely  In divided doses (500 mg every 6 hours  Or 1000 mg every 12 hours.)   This is PREFERABLE AND SAFER THAN  DAILY USE OF IBUPROFEN, ALEVE, OR MELOXICAM   Your annual mammogram has been ordered AND IS DUE in January.  [facility] will not allow [place] to schedule it for you,  so please  call to make your appointment [phone]

## 2022-01-01 NOTE — Assessment & Plan Note (Addendum)
Thyroid function is WNL on current dose of NBO Synthroid 100 mcg daily .  No current changes needed.   Lab Results  Component Value Date   TSH 0.680 12/30/2021

## 2022-01-01 NOTE — Assessment & Plan Note (Signed)
She has no history of exertional or rest chest pain and has had a trial of statin which caused myalgias

## 2022-01-27 ENCOUNTER — Other Ambulatory Visit: Payer: Self-pay | Admitting: Internal Medicine

## 2022-02-02 DIAGNOSIS — H2513 Age-related nuclear cataract, bilateral: Secondary | ICD-10-CM | POA: Diagnosis not present

## 2022-02-02 DIAGNOSIS — H353131 Nonexudative age-related macular degeneration, bilateral, early dry stage: Secondary | ICD-10-CM | POA: Diagnosis not present

## 2022-04-02 ENCOUNTER — Encounter: Payer: Self-pay | Admitting: Internal Medicine

## 2022-04-02 DIAGNOSIS — M180 Bilateral primary osteoarthritis of first carpometacarpal joints: Secondary | ICD-10-CM | POA: Diagnosis not present

## 2022-05-01 ENCOUNTER — Other Ambulatory Visit: Payer: Self-pay | Admitting: Internal Medicine

## 2022-05-01 MED ORDER — MELOXICAM 15 MG PO TABS: 15.0000 mg | ORAL_TABLET | Freq: Every day | ORAL | 0 refills | Status: DC

## 2022-05-31 ENCOUNTER — Other Ambulatory Visit: Payer: Self-pay | Admitting: Internal Medicine

## 2022-05-31 DIAGNOSIS — Z1231 Encounter for screening mammogram for malignant neoplasm of breast: Secondary | ICD-10-CM

## 2022-06-12 DIAGNOSIS — H01009 Unspecified blepharitis unspecified eye, unspecified eyelid: Secondary | ICD-10-CM | POA: Diagnosis not present

## 2022-06-12 DIAGNOSIS — H0011 Chalazion right upper eyelid: Secondary | ICD-10-CM | POA: Diagnosis not present

## 2022-06-29 ENCOUNTER — Other Ambulatory Visit: Payer: Self-pay | Admitting: Internal Medicine

## 2022-07-10 ENCOUNTER — Ambulatory Visit
Admission: RE | Admit: 2022-07-10 | Discharge: 2022-07-10 | Disposition: A | Discharge status: Home / Self Care | Payer: Medicare HMO | Source: Ambulatory Visit | Attending: Internal Medicine | Admitting: Internal Medicine

## 2022-07-10 DIAGNOSIS — Z1231 Encounter for screening mammogram for malignant neoplasm of breast: Secondary | ICD-10-CM | POA: Insufficient documentation

## 2022-07-13 ENCOUNTER — Encounter: Payer: Self-pay | Admitting: Internal Medicine

## 2022-07-19 ENCOUNTER — Ambulatory Visit: Payer: Medicare HMO | Admitting: Internal Medicine

## 2022-08-29 ENCOUNTER — Encounter: Payer: Self-pay | Admitting: Internal Medicine

## 2022-09-03 ENCOUNTER — Encounter: Payer: Self-pay | Admitting: Internal Medicine

## 2022-09-03 ENCOUNTER — Ambulatory Visit (INDEPENDENT_AMBULATORY_CARE_PROVIDER_SITE_OTHER): Payer: Medicare HMO | Admitting: Internal Medicine

## 2022-09-03 VITALS — BP 138/82 | HR 63 | Temp 98.1°F | Ht 66.0 in | Wt 160.0 lb

## 2022-09-03 DIAGNOSIS — I2584 Coronary atherosclerosis due to calcified coronary lesion: Secondary | ICD-10-CM | POA: Diagnosis not present

## 2022-09-03 DIAGNOSIS — I700 Atherosclerosis of aorta: Secondary | ICD-10-CM | POA: Diagnosis not present

## 2022-09-03 DIAGNOSIS — Z1159 Encounter for screening for other viral diseases: Secondary | ICD-10-CM | POA: Diagnosis not present

## 2022-09-03 DIAGNOSIS — E782 Mixed hyperlipidemia: Secondary | ICD-10-CM | POA: Diagnosis not present

## 2022-09-03 DIAGNOSIS — Z13820 Encounter for screening for osteoporosis: Secondary | ICD-10-CM

## 2022-09-03 DIAGNOSIS — G720 Drug-induced myopathy: Secondary | ICD-10-CM

## 2022-09-03 DIAGNOSIS — H6123 Impacted cerumen, bilateral: Secondary | ICD-10-CM

## 2022-09-03 DIAGNOSIS — E890 Postprocedural hypothyroidism: Secondary | ICD-10-CM

## 2022-09-03 DIAGNOSIS — R030 Elevated blood-pressure reading, without diagnosis of hypertension: Secondary | ICD-10-CM | POA: Diagnosis not present

## 2022-09-03 LAB — COMPREHENSIVE METABOLIC PANEL
ALT: 24 U/L (ref 0–35)
AST: 23 U/L (ref 0–37)
Albumin: 4 g/dL (ref 3.5–5.2)
Alkaline Phosphatase: 70 U/L (ref 39–117)
BUN: 17 mg/dL (ref 6–23)
CO2: 27 mEq/L (ref 19–32)
Calcium: 9.3 mg/dL (ref 8.4–10.5)
Chloride: 104 mEq/L (ref 96–112)
Creatinine, Ser: 0.6 mg/dL (ref 0.40–1.20)
GFR: 87.96 mL/min (ref >60.00)
Glucose, Bld: 84 mg/dL (ref 70–99)
Potassium: 4 mEq/L (ref 3.5–5.1)
Sodium: 138 mEq/L (ref 135–145)
Total Bilirubin: 0.9 mg/dL (ref 0.2–1.2)
Total Protein: 7 g/dL (ref 6.0–8.3)

## 2022-09-03 LAB — CBC WITH DIFFERENTIAL/PLATELET
Basophils Absolute: 0 10*3/uL (ref 0.0–0.1)
Basophils Relative: 0.5 % (ref 0.0–3.0)
Eosinophils Absolute: 0.1 10*3/uL (ref 0.0–0.7)
Eosinophils Relative: 2.6 % (ref 0.0–5.0)
HCT: 42 % (ref 36.0–46.0)
Hemoglobin: 14 g/dL (ref 12.0–15.0)
Lymphocytes Relative: 32.4 % (ref 12.0–46.0)
Lymphs Abs: 1.5 10*3/uL (ref 0.7–4.0)
MCHC: 33.3 g/dL (ref 30.0–36.0)
MCV: 93.8 fl (ref 78.0–100.0)
Monocytes Absolute: 0.6 10*3/uL (ref 0.1–1.0)
Monocytes Relative: 12.5 % — ABNORMAL HIGH (ref 3.0–12.0)
Neutro Abs: 2.4 10*3/uL (ref 1.4–7.7)
Neutrophils Relative %: 52 % (ref 43.0–77.0)
Platelets: 175 10*3/uL (ref 150.0–400.0)
RBC: 4.48 Mil/uL (ref 3.87–5.11)
RDW: 13.7 % (ref 11.5–15.5)
WBC: 4.6 10*3/uL (ref 4.0–10.5)

## 2022-09-03 LAB — LIPID PANEL
Cholesterol: 191 mg/dL (ref 0–200)
HDL: 68.4 mg/dL (ref >39.00)
LDL Cholesterol: 93 mg/dL (ref 0–99)
NonHDL: 123.06
Total CHOL/HDL Ratio: 3
Triglycerides: 151 mg/dL — ABNORMAL HIGH (ref 0.0–149.0)
VLDL: 30.2 mg/dL (ref 0.0–40.0)

## 2022-09-03 LAB — LDL CHOLESTEROL, DIRECT: Direct LDL: 107 mg/dL

## 2022-09-03 LAB — TSH: TSH: 0.86 u[IU]/mL (ref 0.35–5.50)

## 2022-09-03 MED ORDER — SYNTHROID 100 MCG PO TABS: ORAL_TABLET | ORAL | 1 refills | Status: DC

## 2022-09-03 MED ORDER — METOPROLOL SUCCINATE ER 25 MG PO TB24: 25.0000 mg | ORAL_TABLET | Freq: Every day | ORAL | 3 refills | Status: DC

## 2022-09-03 NOTE — Assessment & Plan Note (Signed)
She has deferred subsequent trial of statin due to myopathy   Lab Results  Component Value Date   CHOL 212 (H) 12/30/2021   HDL 73 12/30/2021   LDLCALC 126 (H) 12/30/2021   TRIG 64 12/30/2021   CHOLHDL 2.9 12/30/2021

## 2022-09-03 NOTE — Progress Notes (Unsigned)
Subjective:  Patient ID: Kellie Roberson, female    DOB: May 25, 1945  Age: 77 y.o. MRN: [id number]  CC: The primary encounter diagnosis was Postoperative hypothyroidism. Diagnoses of Mixed hyperlipidemia, Statin myopathy [G72.0, T46.6X5A], Coronary atherosclerosis due to calcified coronary lesion (CODE), Hearing loss secondary to cerumen impaction, bilateral, Screening for osteoporosis, Need for hepatitis C screening test, Thoracic aortic atherosclerosis (HCC), and White coat syndrome with high blood pressure but without hypertension were also pertinent to this visit.   HPI Kellie Roberson presents for  Chief Complaint  Patient presents with   Medical Management of Chronic Issues    6 month follow up    1) right ear feeling full .  History of cerumen impaction.  Has been using swim ear drops ; thinks she has water trapped behind wax. Reports decreased hearing.      2) Hypothyroid: she is resists to changing Synthroid dose even though TSH is < 1.0  3) CAD:  coronary calcium score of 44 noted on Nov 2020 CT scan, done after an elevated troponin during hospitalization  for SVT which led to a cardiology evaluation.  She continues to defer statin therapy .   4) SVT:  taking metoprolol.  No recent runs    Outpatient Medications Prior to Visit  Medication Sig Dispense Refill   Ascorbic Acid (VITAMIN C) 1000 MG tablet Take 1,000 mg by mouth daily.     aspirin 81 MG EC tablet Take 1 tablet (81 mg total) by mouth daily. Swallow whole. 30 tablet 12   estradiol (CLIMARA - DOSED IN MG/24 HR) 0.025 mg/24hr patch USE AS DIRECTED 4 patch 3   famotidine (PEPCID) 20 MG tablet Take 20 mg by mouth daily before supper.     meloxicam (MOBIC) 15 MG tablet Take 1 tablet (15 mg total) by mouth daily. 90 tablet 0   Multiple Vitamin (MULTIVITAMIN WITH MINERALS) TABS tablet Take 1 tablet by mouth daily.     Omega-3 Fatty Acids (FISH OIL) 1000 MG CAPS Take 1,000 mg by mouth daily.     Probiotic Product (PROBIOTIC-10  PO) Take by mouth daily.     metoprolol succinate (TOPROL XL) 25 MG 24 hr tablet Take 1 tablet (25 mg total) by mouth daily. 90 tablet 3   SYNTHROID 100 MCG tablet TAKE 1 TABLET BY MOUTH EVERY DAY 90 tablet 1   No facility-administered medications prior to visit.    Review of Systems;  Patient denies headache, fevers, malaise, unintentional weight loss, skin rash, eye pain, sinus congestion and sinus pain, sore throat, dysphagia,  hemoptysis , cough, dyspnea, wheezing, chest pain, palpitations, orthopnea, edema, abdominal pain, nausea, melena, diarrhea, constipation, flank pain, dysuria, hematuria, urinary  Frequency, nocturia, numbness, tingling, seizures,  Focal weakness, Loss of consciousness,  Tremor, insomnia, depression, anxiety, and suicidal ideation.      Objective:  BP 138/82   Pulse 63   Temp 98.1 F (36.7 C) (Oral)   Ht 5\' 6"  (1.676 m)   Wt 160 lb (72.6 kg)   SpO2 98%   BMI 25.82 kg/m   BP Readings from Last 3 Encounters:  09/03/22 138/82  01/01/22 122/82  10/06/21 138/78    Wt Readings from Last 3 Encounters:  09/03/22 160 lb (72.6 kg)  01/01/22 157 lb (71.2 kg)  12/05/21 157 lb (71.2 kg)    Physical Exam Vitals reviewed.  Constitutional:      General: She is not in acute distress.    Appearance: Normal appearance. She is normal  weight. She is not ill-appearing, toxic-appearing or diaphoretic.  HENT:     Head: Normocephalic.     Right Ear: There is impacted cerumen.     Left Ear: There is impacted cerumen.  Eyes:     General: No scleral icterus.       Right eye: No discharge.        Left eye: No discharge.     Conjunctiva/sclera: Conjunctivae normal.  Cardiovascular:     Rate and Rhythm: Normal rate and regular rhythm.     Heart sounds: Normal heart sounds.  Pulmonary:     Effort: Pulmonary effort is normal. No respiratory distress.     Breath sounds: Normal breath sounds.  Musculoskeletal:        General: Normal range of motion.  Skin:     General: Skin is warm and dry.  Neurological:     General: No focal deficit present.     Mental Status: She is alert and oriented to person, place, and time. Mental status is at baseline.  Psychiatric:        Mood and Affect: Mood normal.        Behavior: Behavior normal.        Thought Content: Thought content normal.        Judgment: Judgment normal.    No results found for: "HGBA1C"  Lab Results  Component Value Date   CREATININE 0.60 09/03/2022   CREATININE 0.68 12/30/2021   CREATININE 0.65 01/25/2021    Lab Results  Component Value Date   WBC 4.6 09/03/2022   HGB 14.0 09/03/2022   HCT 42.0 09/03/2022   PLT 175.0 09/03/2022   GLUCOSE 84 09/03/2022   CHOL 191 09/03/2022   TRIG 151.0 (H) 09/03/2022   HDL 68.40 09/03/2022   LDLDIRECT 107.0 09/03/2022   LDLCALC 93 09/03/2022   ALT 24 09/03/2022   AST 23 09/03/2022   NA 138 09/03/2022   K 4.0 09/03/2022   CL 104 09/03/2022   CREATININE 0.60 09/03/2022   BUN 17 09/03/2022   CO2 27 09/03/2022   TSH 0.86 09/03/2022   MICROALBUR <0.7 01/01/2022    MM 3D SCREENING MAMMOGRAM BILATERAL BREAST  Result Date: 07/11/2022 CLINICAL DATA:  Screening. EXAM: DIGITAL SCREENING BILATERAL MAMMOGRAM WITH TOMOSYNTHESIS AND CAD TECHNIQUE: Bilateral screening digital craniocaudal and mediolateral oblique mammograms were obtained. Bilateral screening digital breast tomosynthesis was performed. The images were evaluated with computer-aided detection. COMPARISON:  Previous exam(s). ACR Breast Density Category b: There are scattered areas of fibroglandular density. FINDINGS: There are no findings suspicious for malignancy. IMPRESSION: No mammographic evidence of malignancy. A result letter of this screening mammogram will be mailed directly to the patient. RECOMMENDATION: Screening mammogram in one year. (Code:SM-B-01Y) BI-RADS CATEGORY  1: Negative. Electronically Signed   By: Amie Portland M.D.   On: 07/11/2022 13:34    Assessment & Plan:   .Postoperative hypothyroidism Assessment & Plan: Thyroid function is WNL on current dose of NBO Synthroid 100 mcg daily .  No current changes needed.   Lab Results  Component Value Date   TSH 0.86 09/03/2022    Orders: -     TSH  Mixed hyperlipidemia -     Lipid panel -     LDL cholesterol, direct -     Comprehensive metabolic panel -     CBC with Differential/Platelet  Statin myopathy [G72.0, T46.6X5A]  Coronary atherosclerosis due to calcified coronary lesion (CODE) Assessment & Plan: She has deferred subsequent trial of statin due to  history of statin myopathy   Lab Results  Component Value Date   CHOL 212 (H) 12/30/2021   HDL 73 12/30/2021   LDLCALC 126 (H) 12/30/2021   TRIG 64 12/30/2021   CHOLHDL 2.9 12/30/2021      Hearing loss secondary to cerumen impaction, bilateral Assessment & Plan: She has bilateral impaction resulting in loss of hearing . Referring to [facility]NT for removal of impaction  Orders: -     Ambulatory referral to ENT  Screening for osteoporosis  Need for hepatitis C screening test -     Hepatitis C antibody  Thoracic aortic atherosclerosis (HCC) Assessment & Plan: Noted on coronary calcium CT scan.  Reviewed again with patient today; statin therapy advised but deferred due previous history of myalgias with statin trial  Lab Results  Component Value Date   CHOL 212 (H) 12/30/2021   HDL 73 12/30/2021   LDLCALC 126 (H) 12/30/2021   TRIG 64 12/30/2021   CHOLHDL 2.9 12/30/2021      White coat syndrome with high blood pressure but without hypertension Assessment & Plan: Home machine is readings are 10 pts higher .  HER READING today in the office is normal.  No changes today    Other orders -     Metoprolol Succinate ER; Take 1 tablet (25 mg total) by mouth daily.  Dispense: 90 tablet; Refill: 3 -     Synthroid; TAKE 1 TABLET BY MOUTH EVERY DAY  Dispense: 90 tablet; Refill: 1   Follow-up: No follow-ups on file.   Sherlene Shams, MD

## 2022-09-03 NOTE — Assessment & Plan Note (Signed)
Noted on coronary calcium CT scan.  Reviewed again with patient today; statin therapy advised but deferred due previous history of myalgias with statin trial  Lab Results  Component Value Date   CHOL 212 (H) 12/30/2021   HDL 73 12/30/2021   LDLCALC 126 (H) 12/30/2021   TRIG 64 12/30/2021   CHOLHDL 2.9 12/30/2021    

## 2022-09-03 NOTE — Patient Instructions (Signed)
Referral to [facility] ENT for bilateral cerumen impaction is in progress.  No change to Synthroid dose as long as TSH is > 0.6 today

## 2022-09-03 NOTE — Assessment & Plan Note (Signed)
Home machine is readings are 10 pts higher .  HER READING today in the office is normal.  No changes today

## 2022-09-03 NOTE — Assessment & Plan Note (Signed)
She has bilateral impaction resulting in loss of hearing . Referring to [facility]NT for removal of impaction

## 2022-09-04 NOTE — Assessment & Plan Note (Signed)
Thyroid function is WNL on current dose of NBO Synthroid 100 mcg daily .  No current changes needed.   Lab Results  Component Value Date   TSH 0.86 09/03/2022

## 2022-09-05 LAB — HEPATITIS C ANTIBODY: Hepatitis C Ab: NONREACTIVE

## 2022-09-18 DIAGNOSIS — H902 Conductive hearing loss, unspecified: Secondary | ICD-10-CM | POA: Diagnosis not present

## 2022-09-18 DIAGNOSIS — H6123 Impacted cerumen, bilateral: Secondary | ICD-10-CM | POA: Diagnosis not present

## 2022-10-03 ENCOUNTER — Encounter (INDEPENDENT_AMBULATORY_CARE_PROVIDER_SITE_OTHER): Payer: Self-pay

## 2022-10-16 ENCOUNTER — Ambulatory Visit (INDEPENDENT_AMBULATORY_CARE_PROVIDER_SITE_OTHER): Payer: Medicare HMO | Admitting: *Deleted

## 2022-10-16 VITALS — Ht 66.0 in | Wt 155.0 lb

## 2022-10-16 DIAGNOSIS — Z0000 Encounter for general adult medical examination without abnormal findings: Secondary | ICD-10-CM | POA: Diagnosis not present

## 2022-10-16 NOTE — Patient Instructions (Signed)
[name] , Thank you for taking time to come for your Medicare Wellness Visit. I appreciate your ongoing commitment to your health goals. Please review the following plan we discussed and let me know if I can assist you in the future.   Referrals/Orders/Follow-Ups/Clinician Recommendations: None  This is a list of the screening recommended for you and due dates:  Health Maintenance  Topic Date Due   COVID-19 Vaccine (8 - 2023-24 season) 04/24/2022   Flu Shot  10/04/2022   Cologuard (Stool DNA test)  11/30/2022   Mammogram  07/10/2023   Medicare Annual Wellness Visit  10/16/2023   DTaP/Tdap/Td vaccine (3 - Td or Tdap) 03/14/2031   Pneumonia Vaccine  Completed   DEXA scan (bone density measurement)  Completed   Hepatitis C Screening  Completed   Zoster (Shingles) Vaccine  Completed   HPV Vaccine  Aged Out   Colon Cancer Screening  Discontinued    /Advanced directives: (In Chart) A copy of your advanced directives are scanned into your chart should your provider ever need it. / @ 1:15 Next Medicare Annual Wellness Visit scheduled for next year: Yes   Preventive Care 75 Years and Older, Female Preventive care refers to lifestyle choices and visits with your health care provider that can promote health and wellness. What does preventive care include? A yearly physical exam. This is also called an annual well check. Dental exams once or twice a year. Routine eye exams. Ask your health care provider how often you should have your eyes checked. Personal lifestyle choices, including: Daily care of your teeth and gums. Regular physical activity. Eating a healthy diet. Avoiding tobacco and drug use. Limiting alcohol use. Practicing safe sex. Taking low-dose aspirin every day. Taking vitamin and mineral supplements as recommended by your health care provider. What happens during an annual well check? The services and screenings done by your health care provider during your annual well  check will depend on your age, overall health, lifestyle risk factors, and family history of disease. Counseling  Your health care provider may ask you questions about your: Alcohol use. Tobacco use. Drug use. Emotional well-being. Home and relationship well-being. Sexual activity. Eating habits. History of falls. Memory and ability to understand (cognition). Work and work Astronomer. Reproductive health. Screening  You may have the following tests or measurements: Height, weight, and BMI. Blood pressure. Lipid and cholesterol levels. These may be checked every 5 years, or more frequently if you are over 2 years old. Skin check. Lung cancer screening. You may have this screening every year starting at age 4 if you have a 30-pack-year history of smoking and currently smoke or have quit within the past 15 years. Fecal occult blood test (FOBT) of the stool. You may have this test every year starting at age 13. Flexible sigmoidoscopy or colonoscopy. You may have a sigmoidoscopy every 5 years or a colonoscopy every 10 years starting at age 48. Hepatitis C blood test. Hepatitis B blood test. Sexually transmitted disease (STD) testing. Diabetes screening. This is done by checking your blood sugar (glucose) after you have not eaten for a while (fasting). You may have this done every 1-3 years. Bone density scan. This is done to screen for osteoporosis. You may have this done starting at age 46. Mammogram. This may be done every 1-2 years. Talk to your health care provider about how often you should have regular mammograms. Talk with your health care provider about your test results, treatment options, and if necessary,  the need for more tests. Vaccines  Your health care provider may recommend certain vaccines, such as: Influenza vaccine. This is recommended every year. Tetanus, diphtheria, and acellular pertussis (Tdap, Td) vaccine. You may need a Td booster every 10 years. Zoster  vaccine. You may need this after age 33. Pneumococcal 13-valent conjugate (PCV13) vaccine. One dose is recommended after age 16. Pneumococcal polysaccharide (PPSV23) vaccine. One dose is recommended after age 25. Talk to your health care provider about which screenings and vaccines you need and how often you need them. This information is not intended to replace advice given to you by your health care provider. Make sure you discuss any questions you have with your health care provider. Document Released: 03/18/2015 Document Revised: 11/09/2015 Document Reviewed: 12/21/2014 Elsevier Interactive Patient Education  2017 [employer].  Fall Prevention in the Home Falls can cause injuries. They can happen to people of all ages. There are many things you can do to make your home safe and to help prevent falls. What can I do on the outside of my home? Regularly fix the edges of walkways and driveways and fix any cracks. Remove anything that might make you trip as you walk through a door, such as a raised step or threshold. Trim any bushes or trees on the path to your home. Use bright outdoor lighting. Clear any walking paths of anything that might make someone trip, such as rocks or tools. Regularly check to see if handrails are loose or broken. Make sure that both sides of any steps have handrails. Any raised decks and porches should have guardrails on the edges. Have any leaves, snow, or ice cleared regularly. Use sand or salt on walking paths during winter. Clean up any spills in your garage right away. This includes oil or grease spills. What can I do in the bathroom? Use night lights. Install grab bars by the toilet and in the tub and shower. Do not use towel bars as grab bars. Use non-skid mats or decals in the tub or shower. If you need to sit down in the shower, use a plastic, non-slip stool. Keep the floor dry. Clean up any water that spills on the floor as soon as it happens. Remove  soap buildup in the tub or shower regularly. Attach bath mats securely with double-sided non-slip rug tape. Do not have throw rugs and other things on the floor that can make you trip. What can I do in the bedroom? Use night lights. Make sure that you have a light by your bed that is easy to reach. Do not use any sheets or blankets that are too big for your bed. They should not hang down onto the floor. Have a firm chair that has side arms. You can use this for support while you get dressed. Do not have throw rugs and other things on the floor that can make you trip. What can I do in the kitchen? Clean up any spills right away. Avoid walking on wet floors. Keep items that you use a lot in easy-to-reach places. If you need to reach something above you, use a strong step stool that has a grab bar. Keep electrical cords out of the way. Do not use floor polish or wax that makes floors slippery. If you must use wax, use non-skid floor wax. Do not have throw rugs and other things on the floor that can make you trip. What can I do with my stairs? Do not leave any items on  the stairs. Make sure that there are handrails on both sides of the stairs and use them. Fix handrails that are broken or loose. Make sure that handrails are as long as the stairways. Check any carpeting to make sure that it is firmly attached to the stairs. Fix any carpet that is loose or worn. Avoid having throw rugs at the top or bottom of the stairs. If you do have throw rugs, attach them to the floor with carpet tape. Make sure that you have a light switch at the top of the stairs and the bottom of the stairs. If you do not have them, ask someone to add them for you. What else can I do to help prevent falls? Wear shoes that: Do not have high heels. Have rubber bottoms. Are comfortable and fit you well. Are closed at the toe. Do not wear sandals. If you use a stepladder: Make sure that it is fully opened. Do not climb a  closed stepladder. Make sure that both sides of the stepladder are locked into place. Ask someone to hold it for you, if possible. Clearly mark and make sure that you can see: Any grab bars or handrails. First and last steps. Where the edge of each step is. Use tools that help you move around (mobility aids) if they are needed. These include: Canes. Walkers. Scooters. Crutches. Turn on the lights when you go into a dark area. Replace any light bulbs as soon as they burn out. Set up your furniture so you have a clear path. Avoid moving your furniture around. If any of your floors are uneven, fix them. If there are any pets around you, be aware of where they are. Review your medicines with your doctor. Some medicines can make you feel dizzy. This can increase your chance of falling. Ask your doctor what other things that you can do to help prevent falls. This information is not intended to replace advice given to you by your health care provider. Make sure you discuss any questions you have with your health care provider. Document Released: 12/16/2008 Document Revised: 07/28/2015 Document Reviewed: 03/26/2014 Elsevier Interactive Patient Education  2017 [employer].

## 2022-10-16 NOTE — Progress Notes (Addendum)
Subjective:   Kellie Roberson is a 77 y.o. female who presents for Medicare Annual (Subsequent) preventive examination.  Visit Complete: Virtual  I connected with  Kellie Roberson on 10/16/22 by a audio enabled telemedicine application and verified that I am speaking with the correct person using two identifiers.  Patient Location: Home  Provider Location: Office/Clinic  I discussed the limitations of evaluation and management by telemedicine. The patient expressed understanding and agreed to proceed.  Patient Medicare AWV questionnaire was completed by the patient on 10/15/22; I have confirmed that all information answered by patient is correct and no changes since this date.  Vital Signs: Unable to obtain new vitals due to this being a telehealth visit.   Review of Systems     Cardiac Risk Factors include: advanced age (>57men, >68 women);Other (see comment), Risk factor comments: PVC, coronary atherosclerosis     Objective:    Today's Vitals   10/16/22 1427  Weight: 155 lb (70.3 kg)  Height: 5\' 6"  (1.676 m)   Body mass index is 25.02 kg/m.     10/16/2022    2:38 PM 10/13/2021    2:47 PM 11/09/2020    8:27 AM 10/12/2020    3:14 PM 02/01/2020    1:05 PM 10/12/2019    2:07 PM 12/20/2018    9:38 AM  Advanced Directives  Does Patient Have a Medical Advance Directive? Yes Yes Yes Yes No Yes No  Type of Estate agent of [place];Living will Healthcare Power of [place];Living will Healthcare Power of [place];Living will Healthcare Power of [place];Living will  Healthcare Power of [place];Living will   Does patient want to make changes to medical advance directive? No - Patient declined No - Patient declined No - Patient declined No - Patient declined  No - Patient declined   Copy of Healthcare Power of Attorney in Chart? Yes - validated most recent copy scanned in chart (See row information) No - copy requested No - copy requested No - copy requested  No - copy  requested     Current Medications (verified) Outpatient Encounter Medications as of 10/16/2022  Medication Sig   Ascorbic Acid (VITAMIN C) 1000 MG tablet Take 1,000 mg by mouth daily.   aspirin 81 MG EC tablet Take 1 tablet (81 mg total) by mouth daily. Swallow whole.   estradiol (CLIMARA - DOSED IN MG/24 HR) 0.025 mg/24hr patch USE AS DIRECTED   famotidine (PEPCID) 20 MG tablet Take 20 mg by mouth daily before supper.   meloxicam (MOBIC) 15 MG tablet Take 1 tablet (15 mg total) by mouth daily.   metoprolol succinate (TOPROL XL) 25 MG 24 hr tablet Take 1 tablet (25 mg total) by mouth daily.   Multiple Vitamin (MULTIVITAMIN WITH MINERALS) TABS tablet Take 1 tablet by mouth daily.   Omega-3 Fatty Acids (FISH OIL) 1000 MG CAPS Take 1,000 mg by mouth daily.   Probiotic Product (PROBIOTIC-10 PO) Take by mouth daily.   SYNTHROID 100 MCG tablet TAKE 1 TABLET BY MOUTH EVERY DAY   No facility-administered encounter medications on file as of 10/16/2022.    Allergies (verified) Patient has no known allergies.   History: Past Medical History:  Diagnosis Date   Cancer (HCC) 09/22/2018   skin ca-basal cell   Thyroid disease    Past Surgical History:  Procedure Laterality Date   ABDOMINAL HYSTERECTOMY  2003   total-had some structural damage after 2nd pregnancy   THYROIDECTOMY  1975   was hyperthyroid  Family History  Problem Relation Age of Onset   Alzheimer's disease Mother    Hypertension Father    Glaucoma Father    Thyroid disease Sister    Mitral valve prolapse Sister    Breast cancer Neg Hx    Social History   Socioeconomic History   Marital status: Married    Spouse name: Not on file   Number of children: Not on file   Years of education: Not on file   Highest education level: Master's degree (e.g., MA, MS, MEng, MEd, MSW, MBA)  Occupational History   Not on file  Tobacco Use   Smoking status: Never   Smokeless tobacco: Never  Vaping Use   Vaping status: Never Used   Substance and Sexual Activity   Alcohol use: Yes    Alcohol/week: 10.0 standard drinks of alcohol    Types: 10 Glasses of wine per week   Drug use: No   Sexual activity: Not on file  Other Topics Concern   Not on file  Social History Narrative   Married   Social Determinants of Health   Financial Resource Strain: Low Risk  (10/15/2022)   Overall Financial Resource Strain (CARDIA)    Difficulty of Paying Living Expenses: Not hard at all  Food Insecurity: No Food Insecurity (10/15/2022)   Hunger Vital Sign    Worried About Running Out of Food in the Last Year: Never true    Ran Out of Food in the Last Year: Never true  Transportation Needs: No Transportation Needs (10/15/2022)   PRAPARE - Administrator, Civil Service (Medical): No    Lack of Transportation (Non-Medical): No  Physical Activity: Sufficiently Active (10/15/2022)   Exercise Vital Sign    Days of Exercise per Week: 7 days    Minutes of Exercise per Session: 120 min  Stress: No Stress Concern Present (10/15/2022)   [employer] of Occupational Health - Occupational Stress Questionnaire    Feeling of Stress : Not at all  Social Connections: Moderately Integrated (10/15/2022)   Social Connection and Isolation Panel [NHANES]    Frequency of Communication with Friends and Family: More than three times a week    Frequency of Social Gatherings with Friends and Family: More than three times a week    Attends Religious Services: Never    Database administrator or Organizations: Yes    Attends Engineer, structural: More than 4 times per year    Marital Status: Married    Tobacco Counseling Counseling given: Not Answered   Clinical Intake:  Pre-visit preparation completed: Yes  Pain : No/denies pain     BMI - recorded: 25.02 Nutritional Status: BMI 25 -29 Overweight Nutritional Risks: None Diabetes: No  How often do you need to have someone help you when you read instructions, pamphlets,  or other written materials from your doctor or pharmacy?: 1 - Never  Interpreter Needed?: No  Information entered by :: R.  LPN   Activities of Daily Living    10/16/2022    2:29 PM 10/15/2022    2:40 PM  In your present state of health, do you have any difficulty performing the following activities:  Hearing? 0 0  Vision? 0 0  Comment glasses   Difficulty concentrating or making decisions? 0 0  Walking or climbing stairs? 0 0  Dressing or bathing? 0 0  Doing errands, shopping? 0 0  Preparing Food and eating ? N N  Using the Toilet? N  N  In the past six months, have you accidently leaked urine? N N  Do you have problems with loss of bowel control? N N  Managing your Medications? N N  Managing your Finances? N N  Housekeeping or managing your Housekeeping? N N    Patient Care Team: Sherlene Shams, MD as PCP - General (Internal Medicine) Debbe Odea, MD as PCP - Cardiology (Cardiology)  Indicate any recent Medical Services you may have received from other than [facility] providers in the past year (date may be approximate).     Assessment:   This is a routine wellness examination for Kellie Roberson.  Hearing/Vision screen Hearing Screening - Comments:: No issues Vision Screening - Comments:: glasses  Dietary issues and exercise activities discussed:     Goals Addressed             This Visit's Progress    Patient Stated       Wants to lose a little weight, eat better       Depression Screen    10/16/2022    2:35 PM 09/03/2022    9:30 AM 01/01/2022    8:25 AM 12/05/2021   11:22 AM 10/13/2021    2:49 PM 12/28/2020    1:42 PM 11/09/2020    8:29 AM  PHQ 2/9 Scores  PHQ - 2 Score 0 0 0 0 0 0 0  PHQ- 9 Score 0  0        Fall Risk    10/15/2022    2:40 PM 09/03/2022    9:30 AM 01/01/2022    8:25 AM 12/05/2021   11:22 AM 10/13/2021    2:49 PM  Fall Risk   Falls in the past year? 0 0  0 0  Number falls in past yr:  0 0 0   Injury with Fall?  0 0 0   Risk for  fall due to :  No Fall Risks No Fall Risks No Fall Risks   Follow up Falls evaluation completed;Falls prevention discussed Falls evaluation completed Falls evaluation completed Falls evaluation completed Falls evaluation completed    MEDICARE RISK AT HOME:  Medicare Risk at Home - 10/16/22 1432     Any stairs in or around the home? Yes    If so, are there any without handrails? No    Home free of loose throw rugs in walkways, pet beds, electrical cords, etc? Yes    Adequate lighting in your home to reduce risk of falls? Yes    Life alert? No    Use of a cane, walker or w/c? No    Grab bars in the bathroom? Yes    Shower chair or bench in shower? No    Elevated toilet seat or a handicapped toilet? No             Cognitive Function:    10/12/2019    2:08 PM  MMSE - Mini Mental State Exam  Not completed: Unable to complete        10/16/2022    2:39 PM 10/06/2018    1:36 PM  6CIT Screen  What Year? 0 points 0 points  What month? 0 points 0 points  What time? 0 points 0 points  Count back from 20 0 points 0 points  Months in reverse 0 points 0 points  Repeat phrase 0 points 0 points  Total Score 0 points 0 points    Immunizations Immunization History  Administered Date(s) Administered   COVID-19, mRNA, vaccine(Comirnaty)12  years and older 02/27/2022   Fluad Quad(high Dose 65+) 11/15/2018, 01/01/2022   Influenza, High Dose Seasonal PF 11/25/2019, 12/30/2020   Moderna Sars-Covid-2 Vaccination 04/06/2019, 05/04/2019, 12/30/2019, 06/25/2020   Pfizer Covid-19 Vaccine Bivalent Booster 42yrs & up 11/23/2020, 07/17/2021   Pneumococcal Conjugate-13 11/04/2015   Pneumococcal Polysaccharide-23 07/11/2017   Td 03/05/2010   Tdap 03/13/2021   Zoster Recombinant(Shingrix) 03/31/2021, 02/02/2022    TDAP status: Up to date  Flu Vaccine status: Up to date  Pneumococcal vaccine status: Up to date  Covid-19 vaccine status: Completed vaccines  Qualifies for Shingles Vaccine? Yes    Zostavax completed No   Shingrix Completed?: Yes  Screening Tests Health Maintenance  Topic Date Due   COVID-19 Vaccine (8 - 2023-24 season) 04/24/2022   Medicare Annual Wellness (AWV)  10/14/2022   INFLUENZA VACCINE  10/04/2022   Fecal DNA (Cologuard)  11/30/2022   DTaP/Tdap/Td (3 - Td or Tdap) 03/14/2031   Pneumonia Vaccine 57+ Years old  Completed   DEXA SCAN  Completed   Hepatitis C Screening  Completed   Zoster Vaccines- Shingrix  Completed   HPV VACCINES  Aged Out   Colonoscopy  Discontinued    Health Maintenance  Health Maintenance Due  Topic Date Due   COVID-19 Vaccine (8 - 2023-24 season) 04/24/2022   Medicare Annual Wellness (AWV)  10/14/2022   INFLUENZA VACCINE  10/04/2022    Colorectal cancer screening: Type of screening: Cologuard. Completed 9/21. Repeat every 3 years  Mammogram status: Completed 5/24. Repeat every year  Bone Density status: Completed 1/23. Results reflect: Bone density results: NORMAL. Repeat every 2 years.  Lung Cancer Screening: (Low Dose CT Chest recommended if Age 66-80 years, 20 pack-year currently smoking OR have quit w/in 15years.) does not qualify.    Additional Screening:  Hepatitis C Screening: does qualify; Completed 7/24  Vision Screening: Recommended annual ophthalmology exams for early detection of glaucoma and other disorders of the eye. Is the patient up to date with their annual eye exam?  Yes  Who is the provider or what is the name of the office in which the patient attends annual eye exams? [facility] If pt is not established with a provider, would they like to be referred to a provider to establish care? No .   Dental Screening: Recommended annual dental exams for proper oral hygiene    Community Resource Referral / Chronic Care Management: CRR required this visit?  No   CCM required this visit?  No     Plan:     I have personally reviewed and noted the following in the patient's chart:   Medical  and social history Use of alcohol, tobacco or illicit drugs  Current medications and supplements including opioid prescriptions. Patient is not currently taking opioid prescriptions. Functional ability and status Nutritional status Physical activity Advanced directives List of other physicians Hospitalizations, surgeries, and ER visits in previous 12 months Vitals Screenings to include cognitive, depression, and falls Referrals and appointments  In addition, I have reviewed and discussed with patient certain preventive protocols, quality metrics, and best practice recommendations. A written personalized care plan for preventive services as well as general preventive health recommendations were provided to patient.     Sydell Axon, LPN   [id number]/7846   After Visit Summary: (MyChart) Due to this being a telephonic visit, the after visit summary with patients personalized plan was offered to patient via MyChart   Nurse Notes: None     I have reviewed the above  information and agree with above.   Duncan Dull, MD

## 2022-11-20 ENCOUNTER — Encounter: Payer: Self-pay | Admitting: Internal Medicine

## 2022-11-22 ENCOUNTER — Other Ambulatory Visit: Payer: Self-pay | Admitting: Internal Medicine

## 2022-11-22 DIAGNOSIS — N814 Uterovaginal prolapse, unspecified: Secondary | ICD-10-CM

## 2022-11-22 DIAGNOSIS — Z90711 Acquired absence of uterus with remaining cervical stump: Secondary | ICD-10-CM

## 2022-12-03 ENCOUNTER — Encounter: Payer: Self-pay | Admitting: Internal Medicine

## 2022-12-03 ENCOUNTER — Other Ambulatory Visit: Payer: Self-pay | Admitting: Internal Medicine

## 2022-12-03 DIAGNOSIS — Z1211 Encounter for screening for malignant neoplasm of colon: Secondary | ICD-10-CM

## 2022-12-10 ENCOUNTER — Other Ambulatory Visit: Payer: Self-pay | Admitting: Internal Medicine

## 2022-12-17 DIAGNOSIS — Z1211 Encounter for screening for malignant neoplasm of colon: Secondary | ICD-10-CM | POA: Diagnosis not present

## 2022-12-22 LAB — COLOGUARD: COLOGUARD: NEGATIVE

## 2022-12-31 ENCOUNTER — Encounter: Payer: Self-pay | Admitting: Cardiology

## 2023-01-05 ENCOUNTER — Encounter: Payer: Self-pay | Admitting: Internal Medicine

## 2023-01-07 ENCOUNTER — Encounter: Payer: Medicare HMO | Admitting: Internal Medicine

## 2023-01-11 ENCOUNTER — Ambulatory Visit: Payer: Medicare HMO | Admitting: Cardiology

## 2023-01-15 ENCOUNTER — Telehealth: Payer: Self-pay | Admitting: Cardiology

## 2023-01-15 NOTE — Telephone Encounter (Signed)
   Name: NIOMI TOWSEND  DOB: 22-Jan-1946  MRN: [id number]  Primary Cardiologist: Debbe Odea, MD  Chart reviewed as part of pre-operative protocol coverage. Because of Jonessa E Suarez's past medical history and time since last visit, she will require a follow-up in-office visit in order to better assess preoperative cardiovascular risk.Last seen 10/06/2021  Pre-op covering staff: - Please schedule appointment and call patient to inform them. If patient already had an upcoming appointment within acceptable timeframe, please add "pre-op clearance" to the appointment notes so provider is aware. - Please contact requesting surgeon's office via preferred method (i.e, phone, fax) to inform them of need for appointment prior to surgery.  Joni Reining, NP  01/15/2023, 11:41 AM

## 2023-01-15 NOTE — Telephone Encounter (Signed)
Pt has in office appt 01/18/23 with Dr. Azucena Cecil. Ok per Joni Reining, DNP pre op APP today to defer clearance to appt with MD.   I will update all parties involved.

## 2023-01-15 NOTE — Telephone Encounter (Signed)
   Pre-operative Risk Assessment    Patient Name: Kellie Roberson  DOB: 13-Sep-1945 MRN: [id number]      Request for Surgical Clearance    Procedure:   laparoscopic vaginal prolapse  Date of Surgery:  Clearance TBD                                 Surgeon:  Lafe Garin MD Surgeon's Group or Practice Name:  [facility] Phone number:  [phone] Fax number:  [phone]   Type of Clearance Requested:   - Medical    Type of Anesthesia:  Not Indicated   Additional requests/questions:    [name]   01/15/2023, 11:31 AM

## 2023-01-15 NOTE — Telephone Encounter (Signed)
UPDATE ON CLEARANCE: Will need ASA hold recommendations.   Pt has appt 01/18/23 with Dr. Azucena Cecil. Will add need preop clearance to appt notes.

## 2023-01-17 ENCOUNTER — Encounter: Payer: Medicare HMO | Admitting: Internal Medicine

## 2023-01-18 ENCOUNTER — Ambulatory Visit: Payer: Medicare HMO | Attending: Cardiology | Admitting: Cardiology

## 2023-01-18 ENCOUNTER — Encounter: Payer: Self-pay | Admitting: Cardiology

## 2023-01-18 VITALS — BP 136/86 | HR 86 | Ht 66.0 in | Wt 157.4 lb

## 2023-01-18 DIAGNOSIS — E785 Hyperlipidemia, unspecified: Secondary | ICD-10-CM

## 2023-01-18 DIAGNOSIS — Z01810 Encounter for preprocedural cardiovascular examination: Secondary | ICD-10-CM

## 2023-01-18 DIAGNOSIS — I471 Supraventricular tachycardia, unspecified: Secondary | ICD-10-CM | POA: Diagnosis not present

## 2023-01-18 NOTE — Progress Notes (Signed)
Cardiology Office Note:    Date:  01/18/2023   ID:  Kellie Roberson, DOB January 21, 1946, MRN [id number]  PCP:  Sherlene Shams, MD  Cardiologist:  Debbe Odea, MD  Electrophysiologist:  None   Referring MD: Sherlene Shams, MD   Chief Complaint  Patient presents with   Follow-up    Patient needing cardiac clearance for surgery that is not scheduled at this time.  Patient denies new or acute cardiac problems/concerns today.      History of Present Illness:    Kellie Roberson is a 77 y.o. female with a hx of hypothyroidism, paroxysmal SVT, hyperlipidemia who presents for follow-up.    Denies any palpitations.  Vaginal prolapse surgery being planned.  Denies chest pain or shortness of breath.  Feels well, no concerns at this time.  Prior notes Transthoracic echo 12/2018 showed normal systolic and diastolic function, EF 55 to 60%.   Coronary CTA 01/2019 had a calcium score of 44, minimal nonobstructive CAD in the distal left main/ostial LAD 0 to 24%.  Cardiac monitor 01/2019 paroxysmal SVT. Statin therapy recommended, patient declined.  Past Medical History:  Diagnosis Date   Cancer (HCC) 09/22/2018   skin ca-basal cell   Thyroid disease     Past Surgical History:  Procedure Laterality Date   ABDOMINAL HYSTERECTOMY  2003   total-had some structural damage after 2nd pregnancy   THYROIDECTOMY  1975   was hyperthyroid    Current Medications: Current Meds  Medication Sig   Ascorbic Acid (VITAMIN C) 1000 MG tablet Take 1,000 mg by mouth daily.   aspirin 81 MG EC tablet Take 1 tablet (81 mg total) by mouth daily. Swallow whole.   estradiol (CLIMARA - DOSED IN MG/24 HR) 0.025 mg/24hr patch USE AS DIRECTED   famotidine (PEPCID) 20 MG tablet Take 20 mg by mouth daily before supper.   meloxicam (MOBIC) 15 MG tablet TAKE 1 TABLET (15 MG TOTAL) BY MOUTH DAILY.   metoprolol succinate (TOPROL XL) 25 MG 24 hr tablet Take 1 tablet (25 mg total) by mouth daily.   Multiple Vitamin  (MULTIVITAMIN WITH MINERALS) TABS tablet Take 1 tablet by mouth daily.   Omega-3 Fatty Acids (FISH OIL) 1000 MG CAPS Take 1,000 mg by mouth daily.   Probiotic Product (PROBIOTIC-10 PO) Take by mouth daily.   SYNTHROID 100 MCG tablet TAKE 1 TABLET BY MOUTH EVERY DAY     Allergies:   Patient has no known allergies.   Social History   Socioeconomic History   Marital status: Married    Spouse name: Not on file   Number of children: Not on file   Years of education: Not on file   Highest education level: Master's degree (e.g., MA, MS, MEng, MEd, MSW, MBA)  Occupational History   Not on file  Tobacco Use   Smoking status: Never   Smokeless tobacco: Never  Vaping Use   Vaping status: Never Used  Substance and Sexual Activity   Alcohol use: Yes    Alcohol/week: 10.0 standard drinks of alcohol    Types: 10 Glasses of wine per week   Drug use: No   Sexual activity: Not on file  Other Topics Concern   Not on file  Social History Narrative   Married   Social Determinants of Health   Financial Resource Strain: Low Risk  (10/15/2022)   Overall Financial Resource Strain (CARDIA)    Difficulty of Paying Living Expenses: Not hard at all  Food Insecurity: No Food  Insecurity (10/15/2022)   Hunger Vital Sign    Worried About Running Out of Food in the Last Year: Never true    Ran Out of Food in the Last Year: Never true  Transportation Needs: No Transportation Needs (10/15/2022)   PRAPARE - Administrator, Civil Service (Medical): No    Lack of Transportation (Non-Medical): No  Physical Activity: Sufficiently Active (10/15/2022)   Exercise Vital Sign    Days of Exercise per Week: 7 days    Minutes of Exercise per Session: 120 min  Stress: No Stress Concern Present (10/15/2022)   [employer] of Occupational Health - Occupational Stress Questionnaire    Feeling of Stress : Not at all  Social Connections: Moderately Integrated (10/15/2022)   Social Connection and Isolation  Panel [NHANES]    Frequency of Communication with Friends and Family: More than three times a week    Frequency of Social Gatherings with Friends and Family: More than three times a week    Attends Religious Services: Never    Database administrator or Organizations: Yes    Attends Engineer, structural: More than 4 times per year    Marital Status: Married     Family History: The patient's family history includes Alzheimer's disease in her mother; Glaucoma in her father; Hypertension in her father; Mitral valve prolapse in her sister; Thyroid disease in her sister. There is no history of Breast cancer.  ROS:   Please see the history of present illness.     All other systems reviewed and are negative.  EKGs/Labs/Other Studies Reviewed:    The following studies were reviewed today:  EKG Interpretation Date/Time:  Friday January 18 2023 10:21:44 EST Ventricular Rate:  66 PR Interval:  144 QRS Duration:  80 QT Interval:  392 QTC Calculation: 410 R Axis:   -28  Text Interpretation: Normal sinus rhythm Nonspecific ST abnormality Confirmed by Debbe Odea ([id number]) on 01/18/2023 10:25:42 AM    Recent Labs: 09/03/2022: ALT 24; BUN 17; Creatinine, Ser 0.60; Hemoglobin 14.0; Platelets 175.0; Potassium 4.0; Sodium 138; TSH 0.86  Recent Lipid Panel    Component Value Date/Time   CHOL 191 09/03/2022 1017   CHOL 229 (H) 07/09/2017 0732   TRIG 151.0 (H) 09/03/2022 1017   HDL 68.40 09/03/2022 1017   HDL 86 07/09/2017 0732   CHOLHDL 3 09/03/2022 1017   VLDL 30.2 09/03/2022 1017   LDLCALC 93 09/03/2022 1017   LDLCALC 128 (H) 07/09/2017 0732   LDLDIRECT 107.0 09/03/2022 1017    Physical Exam:    VS:  BP 136/86 (BP Location: Left Arm, Patient Position: Sitting, Cuff Size: Normal)   Pulse 86   Ht 5\' 6"  (1.676 m)   Wt 157 lb 6.4 oz (71.4 kg)   SpO2 98%   BMI 25.41 kg/m     Wt Readings from Last 3 Encounters:  01/18/23 157 lb 6.4 oz (71.4 kg)  10/16/22 155 lb (70.3  kg)  09/03/22 160 lb (72.6 kg)     GEN:  Well nourished, well developed in no acute distress HEENT: Normal NECK: No JVD; No carotid bruits CARDIAC: RRR, no murmurs, rubs, gallops RESPIRATORY:  Clear to auscultation without rales, wheezing or rhonchi  ABDOMEN: Soft, non-tender, non-distended MUSCULOSKELETAL:  No edema; No deformity  SKIN: Warm and dry NEUROLOGIC:  Alert and oriented x 3 PSYCHIATRIC:  Normal affect   ASSESSMENT:    1. Pre-procedural cardiovascular examination   2. Paroxysmal SVT (supraventricular tachycardia) (HCC)  3. Hyperlipidemia, unspecified hyperlipidemia type    PLAN:    Preprocedural cardiovascular exam, laparoscopic vaginal prolapse surgery being planned.  Okay for procedure from a cardiac perspective.  Patient asymptomatic, previous echo with normal function, coronary CT with no CAD. Palpitations, paroxysmal SVT, PVCs, symptoms adequately managed with Toprol-XL.  Continue Toprol-XL 25 mg daily. Hyperlipidemia, continue low-cholesterol diet.  declines statin therapy.  Follow-up in 1 year  Medication Adjustments/Labs and Tests Ordered: Current medicines are reviewed at length with the patient today.  Concerns regarding medicines are outlined above.  Orders Placed This Encounter  Procedures   EKG 12-Lead   No orders of the defined types were placed in this encounter.   Patient Instructions  Medication Instructions:   Your physician recommends that you continue on your current medications as directed. Please refer to the Current Medication list given to you today.  *If you need a refill on your cardiac medications before your next appointment, please call your pharmacy*   Lab Work:  None Ordered  If you have labs (blood work) drawn today and your tests are completely normal, you will receive your results only by: MyChart Message (if you have MyChart) OR A paper copy in the mail If you have any lab test that is abnormal or we need to change  your treatment, we will call you to review the results.   Testing/Procedures:  None Ordered   Follow-Up: At [facility], you and your health needs are our priority.  As part of our continuing mission to provide you with exceptional heart care, we have created designated Provider Care Teams.  These Care Teams include your primary Cardiologist (physician) and Advanced Practice Providers (APPs -  Physician Assistants and Nurse Practitioners) who all work together to provide you with the care you need, when you need it.  We recommend signing up for the patient portal called "MyChart".  Sign up information is provided on this After Visit Summary.  MyChart is used to connect with patients for Virtual Visits (Telemedicine).  Patients are able to view lab/test results, encounter notes, upcoming appointments, etc.  Non-urgent messages can be sent to your provider as well.   To learn more about what you can do with MyChart, go to [email or web address].    Your next appointment:   12 month(s)  Provider:   You may see Debbe Odea, MD or one of the following Advanced Practice Providers on your designated Care Team:   Nicolasa Ducking, NP Eula Listen, PA-C Cadence Fransico Michael, PA-C Charlsie Quest, NP Carlos Levering, NP    Signed, Debbe Odea, MD  01/18/2023 10:58 AM    [facility]

## 2023-01-18 NOTE — Patient Instructions (Signed)

## 2023-01-23 ENCOUNTER — Encounter: Payer: Self-pay | Admitting: Internal Medicine

## 2023-02-05 DIAGNOSIS — H2513 Age-related nuclear cataract, bilateral: Secondary | ICD-10-CM | POA: Diagnosis not present

## 2023-02-05 DIAGNOSIS — H353131 Nonexudative age-related macular degeneration, bilateral, early dry stage: Secondary | ICD-10-CM | POA: Diagnosis not present

## 2023-02-12 ENCOUNTER — Other Ambulatory Visit: Payer: Self-pay | Admitting: Internal Medicine

## 2023-03-07 ENCOUNTER — Other Ambulatory Visit: Payer: Self-pay | Admitting: Internal Medicine

## 2023-03-19 DIAGNOSIS — H902 Conductive hearing loss, unspecified: Secondary | ICD-10-CM | POA: Diagnosis not present

## 2023-03-19 DIAGNOSIS — H6123 Impacted cerumen, bilateral: Secondary | ICD-10-CM | POA: Diagnosis not present

## 2023-03-26 DIAGNOSIS — Z85828 Personal history of other malignant neoplasm of skin: Secondary | ICD-10-CM | POA: Diagnosis not present

## 2023-03-26 DIAGNOSIS — Z872 Personal history of diseases of the skin and subcutaneous tissue: Secondary | ICD-10-CM | POA: Diagnosis not present

## 2023-03-26 DIAGNOSIS — D492 Neoplasm of unspecified behavior of bone, soft tissue, and skin: Secondary | ICD-10-CM | POA: Diagnosis not present

## 2023-03-26 DIAGNOSIS — L578 Other skin changes due to chronic exposure to nonionizing radiation: Secondary | ICD-10-CM | POA: Diagnosis not present

## 2023-04-02 DIAGNOSIS — N8111 Cystocele, midline: Secondary | ICD-10-CM | POA: Diagnosis not present

## 2023-04-02 DIAGNOSIS — N993 Prolapse of vaginal vault after hysterectomy: Secondary | ICD-10-CM | POA: Diagnosis not present

## 2023-04-04 ENCOUNTER — Encounter: Payer: Self-pay | Admitting: Internal Medicine

## 2023-04-08 ENCOUNTER — Encounter: Payer: Self-pay | Admitting: Cardiology

## 2023-04-08 NOTE — Telephone Encounter (Signed)
 Can you please review

## 2023-04-23 DIAGNOSIS — I2510 Atherosclerotic heart disease of native coronary artery without angina pectoris: Secondary | ICD-10-CM | POA: Diagnosis not present

## 2023-04-23 DIAGNOSIS — N993 Prolapse of vaginal vault after hysterectomy: Secondary | ICD-10-CM | POA: Diagnosis not present

## 2023-04-23 DIAGNOSIS — Z9079 Acquired absence of other genital organ(s): Secondary | ICD-10-CM | POA: Diagnosis not present

## 2023-04-23 DIAGNOSIS — Z7982 Long term (current) use of aspirin: Secondary | ICD-10-CM | POA: Diagnosis not present

## 2023-04-23 DIAGNOSIS — Z90722 Acquired absence of ovaries, bilateral: Secondary | ICD-10-CM | POA: Diagnosis not present

## 2023-04-23 DIAGNOSIS — N736 Female pelvic peritoneal adhesions (postinfective): Secondary | ICD-10-CM | POA: Diagnosis not present

## 2023-04-23 DIAGNOSIS — Z90710 Acquired absence of both cervix and uterus: Secondary | ICD-10-CM | POA: Diagnosis not present

## 2023-04-23 DIAGNOSIS — Z79899 Other long term (current) drug therapy: Secondary | ICD-10-CM | POA: Diagnosis not present

## 2023-04-24 ENCOUNTER — Other Ambulatory Visit: Payer: Self-pay | Admitting: Internal Medicine

## 2023-04-29 ENCOUNTER — Telehealth (INDEPENDENT_AMBULATORY_CARE_PROVIDER_SITE_OTHER): Payer: Medicare HMO | Admitting: Internal Medicine

## 2023-04-29 VITALS — BP 105/75 | Ht 66.0 in | Wt 157.4 lb

## 2023-04-29 DIAGNOSIS — M2550 Pain in unspecified joint: Secondary | ICD-10-CM

## 2023-04-29 DIAGNOSIS — I700 Atherosclerosis of aorta: Secondary | ICD-10-CM | POA: Diagnosis not present

## 2023-04-29 DIAGNOSIS — M791 Myalgia, unspecified site: Secondary | ICD-10-CM

## 2023-04-29 DIAGNOSIS — Z9889 Other specified postprocedural states: Secondary | ICD-10-CM

## 2023-04-29 DIAGNOSIS — T466X5A Adverse effect of antihyperlipidemic and antiarteriosclerotic drugs, initial encounter: Secondary | ICD-10-CM

## 2023-04-29 DIAGNOSIS — R030 Elevated blood-pressure reading, without diagnosis of hypertension: Secondary | ICD-10-CM

## 2023-04-29 MED ORDER — OMEPRAZOLE 20 MG PO CPDR: 20.0000 mg | DELAYED_RELEASE_CAPSULE | Freq: Every day | ORAL | 3 refills | Status: DC

## 2023-04-29 MED ORDER — ONDANSETRON HCL 4 MG PO TABS: 4.0000 mg | ORAL_TABLET | Freq: Three times a day (TID) | ORAL | 0 refills | Status: DC | PRN

## 2023-04-29 NOTE — Patient Instructions (Signed)
 So glad your recovery is going so smoothly!  Regarding the meloxicam:  The less you need, the better.  Try taking 7.5 mg daily instead of 15 mg daily,  plus [phone] mg tylenol every 12 hours If needed   Prilosec (omeprazole)  should be taken on an empty stomach 30 minutes prior to food or liquid to improve absorption.  It has to BIND to acid receptors (so food and beverages will interfere if ingested too soon)  We'll check your B12 and Vitamin d levels the next time you are due for labs.

## 2023-04-29 NOTE — Progress Notes (Unsigned)
 Virtual Visit via Caregility   Note   This format is felt to be most appropriate for this patient at this time.  All issues noted in this document were discussed and addressed.  No physical exam was performed (except for noted visual exam findings with Video Visits).   I connected with  Kellie Roberson  on 04/29/23 at  5:00 PM EST by a video enabled telemedicine application  and verified that I am speaking with the correct person using two identifiers. Location patient: home Location provider: work or home office Persons participating in the virtual visit: patient, provider  I discussed the limitations, risks, security and privacy concerns of performing an evaluation and management service by telephone and the availability of in person appointments. I also discussed with the patient that there may be a patient responsible charge related to this service. The patient expressed understanding and agreed to proceed.   Reason for visit: hospital follow up   HPI:  Kellie Roberson is  78 yr old female who underwent laparoscopic  colpoplexy  with sling and sacrocolpoplexy on Feb 18 at [facility] by Lafe Garin.  She has had no complications post operatively thus far.  She has noted an increase in  joint pain during the 2 week suspension of meloxicam,  and her GERD symptoms have completely resolved.  She is no longer using opioids prescribed post operatively and has been taking motrin 600 mg bid and tylenol 1000 mg bid  post operatively.  She would like to resume meloxciam and add PPI for GERD management instead of famotidine    ROS: See pertinent positives and negatives per HPI.  Past Medical History:  Diagnosis Date   Cancer (HCC) 09/22/2018   skin ca-basal cell   Thyroid disease     Past Surgical History:  Procedure Laterality Date   ABDOMINAL HYSTERECTOMY  2003   total-had some structural damage after 2nd pregnancy   THYROIDECTOMY  1975   was hyperthyroid    Family History  Problem Relation Age of Onset    Alzheimer's disease Mother    Hypertension Father    Glaucoma Father    Thyroid disease Sister    Mitral valve prolapse Sister    Breast cancer Neg Hx     SOCIAL HX: ***   Current Outpatient Medications:    acetaminophen (TYLENOL) 325 MG tablet, Take 650 mg by mouth every 4 (four) hours as needed., Disp: , Rfl:    Ascorbic Acid (VITAMIN C) 1000 MG tablet, Take 1,000 mg by mouth daily., Disp: , Rfl:    aspirin 81 MG EC tablet, Take 1 tablet (81 mg total) by mouth daily. Swallow whole., Disp: 30 tablet, Rfl: 12   estradiol (CLIMARA - DOSED IN MG/24 HR) 0.025 mg/24hr patch, USE AS DIRECTED, Disp: 12 patch, Rfl: 1   famotidine (PEPCID) 20 MG tablet, Take 20 mg by mouth daily before supper., Disp: , Rfl:    ibuprofen (ADVIL) 600 MG tablet, Take 600 mg by mouth every 4 (four) hours as needed., Disp: , Rfl:    metoprolol succinate (TOPROL XL) 25 MG 24 hr tablet, Take 1 tablet (25 mg total) by mouth daily., Disp: 90 tablet, Rfl: 3   Multiple Vitamin (MULTIVITAMIN WITH MINERALS) TABS tablet, Take 1 tablet by mouth daily., Disp: , Rfl:    Omega-3 Fatty Acids (FISH OIL) 1000 MG CAPS, Take 1,000 mg by mouth daily., Disp: , Rfl:    Probiotic Product (PROBIOTIC-10 PO), Take by mouth daily., Disp: , Rfl:    SYNTHROID  100 MCG tablet, TAKE 1 TABLET BY MOUTH EVERY DAY, Disp: 90 tablet, Rfl: 1   meloxicam (MOBIC) 15 MG tablet, TAKE 1 TABLET (15 MG TOTAL) BY MOUTH DAILY. (Patient not taking: Reported on 04/29/2023), Disp: 90 tablet, Rfl: 0  EXAM:  VITALS per patient if applicable:  GENERAL: alert, oriented, appears well and in no acute distress  HEENT: atraumatic, conjunttiva clear, no obvious abnormalities on inspection of external nose and ears  NECK: normal movements of the head and neck  LUNGS: on inspection no signs of respiratory distress, breathing rate appears normal, no obvious gross SOB, gasping or wheezing  CV: no obvious cyanosis  MS: moves all visible extremities without noticeable  abnormality  PSYCH/NEURO: pleasant and cooperative, no obvious depression or anxiety, speech and thought processing grossly intact  ASSESSMENT AND PLAN: There are no diagnoses linked to this encounter.    I discussed the assessment and treatment plan with the patient. The patient was provided an opportunity to ask questions and all were answered. The patient agreed with the plan and demonstrated an understanding of the instructions.   The patient was advised to call back or seek an in-person evaluation if the symptoms worsen or if the condition fails to improve as anticipated.   I spent 30 minutes dedicated to the care of this patient on the date of this encounter to include pre-visit review of his medical history,  Face-to-face time with the patient , and post visit ordering of testing and therapeutics.    Sherlene Shams, MD

## 2023-04-30 DIAGNOSIS — Z9889 Other specified postprocedural states: Secondary | ICD-10-CM | POA: Insufficient documentation

## 2023-04-30 DIAGNOSIS — M2550 Pain in unspecified joint: Secondary | ICD-10-CM | POA: Insufficient documentation

## 2023-04-30 NOTE — Assessment & Plan Note (Signed)
 Home machine  readings were revewed today and well under 130/80

## 2023-04-30 NOTE — Assessment & Plan Note (Signed)
 Secondary to OA.  She has noted increased diffuse joint pain since suspending meloxicam for her surgery and has been advised to resume at lowest dose needed to manage pain with addition of tylenol 1000 mg bid

## 2023-04-30 NOTE — Assessment & Plan Note (Signed)
 Noted on coronary calcium CT scan.  Reviewed again with patient today; statin therapy advised but deferred due previous history of myalgias with statin trial  Lab Results  Component Value Date   CHOL 191 09/03/2022   HDL 68.40 09/03/2022   LDLCALC 93 09/03/2022   LDLDIRECT 107.0 09/03/2022   TRIG 151.0 (H) 09/03/2022   CHOLHDL 3 09/03/2022

## 2023-04-30 NOTE — Assessment & Plan Note (Signed)
 Trial of twice weekly statimn caused yalgias

## 2023-04-30 NOTE — Assessment & Plan Note (Signed)
 Done laparoscopically Feb 14 by Dr Doy Hutching to manage vaginal vault prolapse following hysterectomy.    No complications.  Patient recpovering well

## 2023-05-01 ENCOUNTER — Encounter: Payer: Self-pay | Admitting: Internal Medicine

## 2023-05-01 ENCOUNTER — Other Ambulatory Visit: Payer: Self-pay | Admitting: Internal Medicine

## 2023-05-01 MED ORDER — OMEPRAZOLE 10 MG PO CPDR: 10.0000 mg | DELAYED_RELEASE_CAPSULE | Freq: Every day | ORAL | 2 refills | Status: DC

## 2023-05-10 ENCOUNTER — Other Ambulatory Visit
Admission: RE | Admit: 2023-05-10 | Discharge: 2023-05-10 | Disposition: A | Discharge status: Home / Self Care | Attending: Obstetrics and Gynecology | Admitting: Obstetrics and Gynecology

## 2023-05-10 ENCOUNTER — Telehealth: Payer: Self-pay

## 2023-05-10 DIAGNOSIS — R300 Dysuria: Secondary | ICD-10-CM | POA: Diagnosis not present

## 2023-05-10 LAB — URINALYSIS, COMPLETE (UACMP) WITH MICROSCOPIC
Bilirubin Urine: NEGATIVE
Glucose, UA: NEGATIVE mg/dL
Hgb urine dipstick: NEGATIVE
Ketones, ur: NEGATIVE mg/dL
Leukocytes,Ua: NEGATIVE
Nitrite: NEGATIVE
Protein, ur: NEGATIVE mg/dL
Specific Gravity, Urine: 1.01 (ref 1.005–1.030)
pH: 5.5 (ref 5.0–8.0)

## 2023-05-10 NOTE — Telephone Encounter (Signed)
 Lvm informing pt she will need a office visit. Okay to schedule pt if she calls back

## 2023-05-10 NOTE — Telephone Encounter (Signed)
 Copied from CRM [id number]. Topic: Clinical - Request for Lab/Test Order >> May 10, 2023  8:16 AM Desma Mcgregor wrote: Reason for CRM: Pt had surgery two weeks ago at [facility] and needs to have a urine test done to check for UTI. Please contact pt once orders are placed.

## 2023-05-12 LAB — URINE CULTURE

## 2023-05-16 DIAGNOSIS — R300 Dysuria: Secondary | ICD-10-CM | POA: Diagnosis not present

## 2023-06-08 ENCOUNTER — Other Ambulatory Visit: Payer: Self-pay | Admitting: Internal Medicine

## 2023-07-27 ENCOUNTER — Other Ambulatory Visit: Payer: Self-pay | Admitting: Internal Medicine

## 2023-07-30 DIAGNOSIS — N393 Stress incontinence (female) (male): Secondary | ICD-10-CM | POA: Diagnosis not present

## 2023-07-30 DIAGNOSIS — N993 Prolapse of vaginal vault after hysterectomy: Secondary | ICD-10-CM | POA: Diagnosis not present

## 2023-07-30 DIAGNOSIS — N8111 Cystocele, midline: Secondary | ICD-10-CM | POA: Diagnosis not present

## 2023-07-30 NOTE — Telephone Encounter (Signed)
 Medication was discontinued on 05/01/2023. Is it okay to refuse refill request?

## 2023-07-31 ENCOUNTER — Other Ambulatory Visit: Payer: Self-pay | Admitting: Internal Medicine

## 2023-08-14 DIAGNOSIS — I2510 Atherosclerotic heart disease of native coronary artery without angina pectoris: Secondary | ICD-10-CM | POA: Diagnosis not present

## 2023-08-14 DIAGNOSIS — Z7982 Long term (current) use of aspirin: Secondary | ICD-10-CM | POA: Diagnosis not present

## 2023-08-14 DIAGNOSIS — Z79899 Other long term (current) drug therapy: Secondary | ICD-10-CM | POA: Diagnosis not present

## 2023-08-14 DIAGNOSIS — N393 Stress incontinence (female) (male): Secondary | ICD-10-CM | POA: Diagnosis not present

## 2023-08-14 DIAGNOSIS — Z9889 Other specified postprocedural states: Secondary | ICD-10-CM | POA: Diagnosis not present

## 2023-08-15 IMAGING — MG MM DIGITAL SCREENING BILAT W/ TOMO AND CAD
8 series · 8 of 24 positions shown · non-contrast
Comparison: Previous exam(s).

CLINICAL DATA: Screening.

EXAM:
DIGITAL SCREENING BILATERAL MAMMOGRAM WITH TOMOSYNTHESIS AND CAD
TECHNIQUE: Bilateral screening digital craniocaudal and mediolateral oblique
mammograms were obtained. Bilateral screening digital breast
tomosynthesis was performed. The images were evaluated with
computer-aided detection.

[L MLO synth-2D]
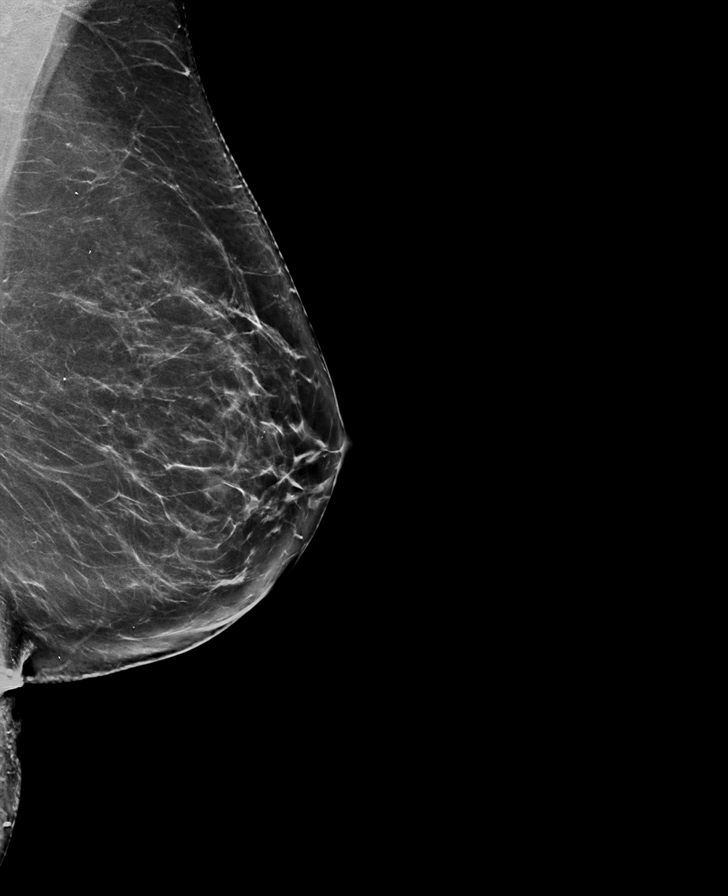

[R CC synth-2D]
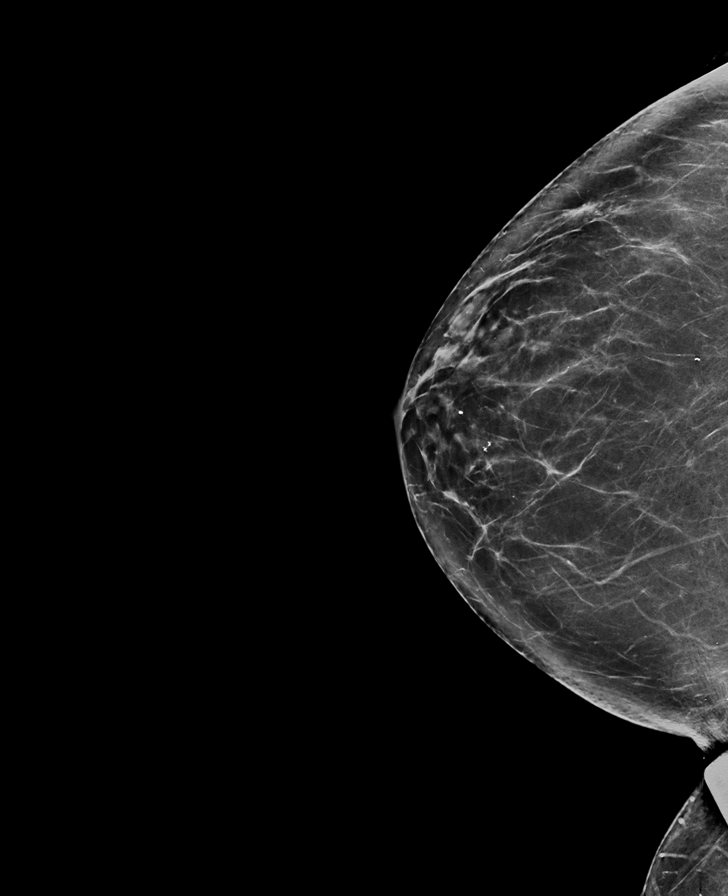

[L CC synth-2D]
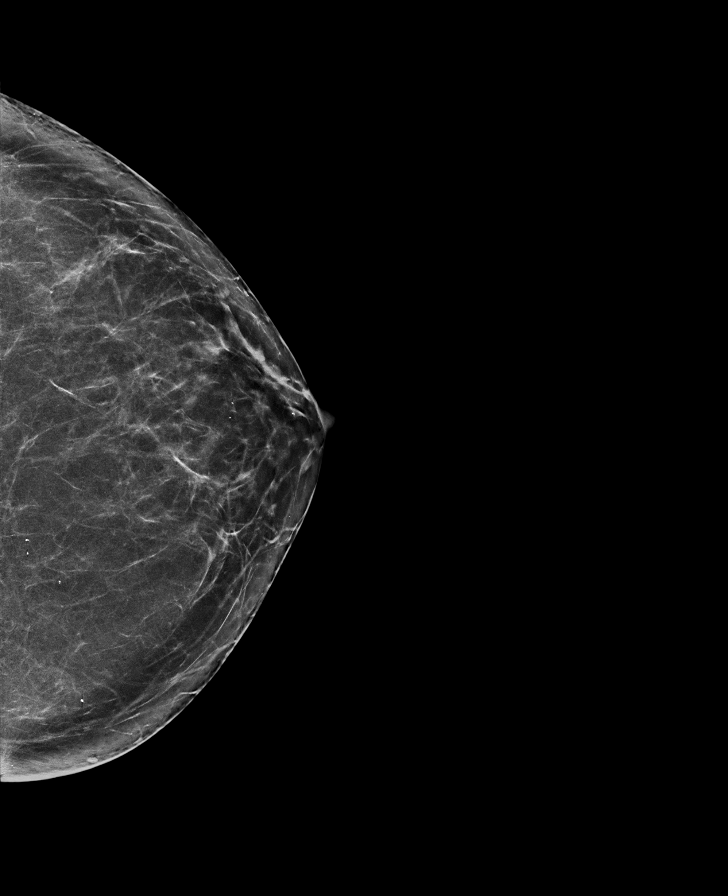

[R MLO synth-2D]
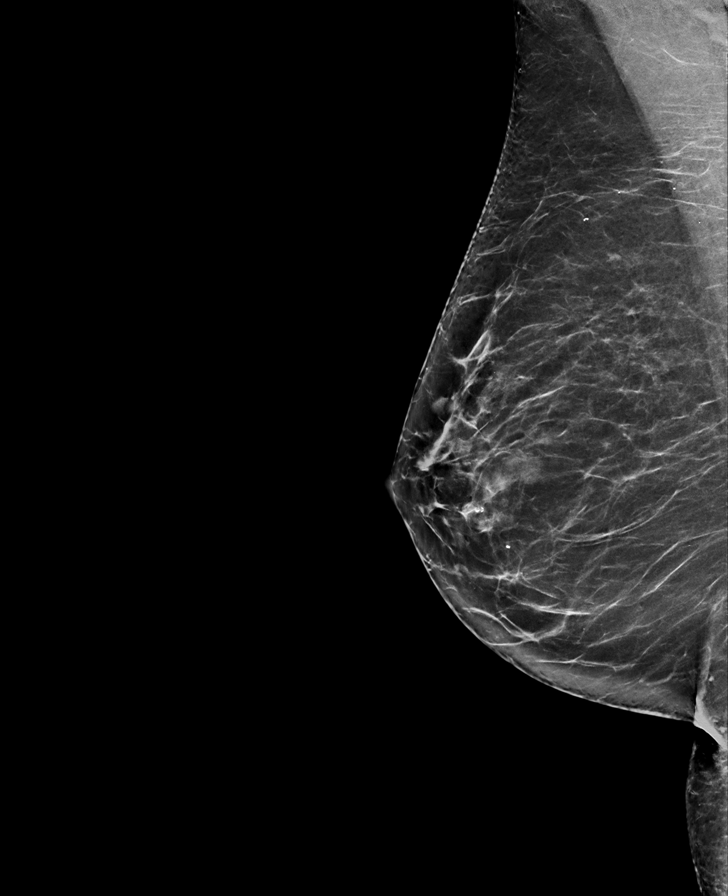

[R CC tomo · tomo slice 41/80.0]
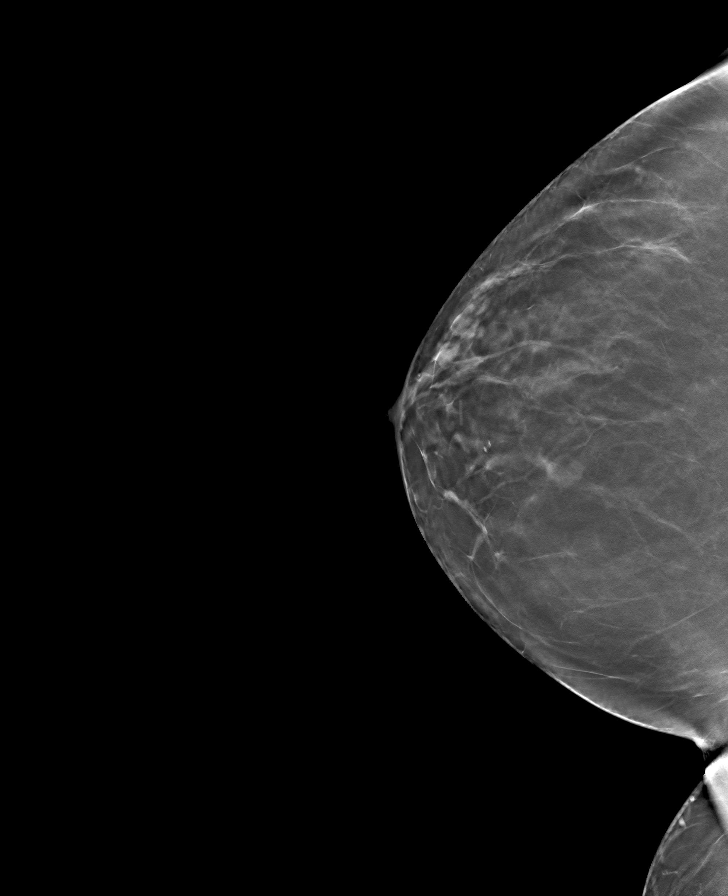

[L MLO tomo · tomo slice 41/80.0]
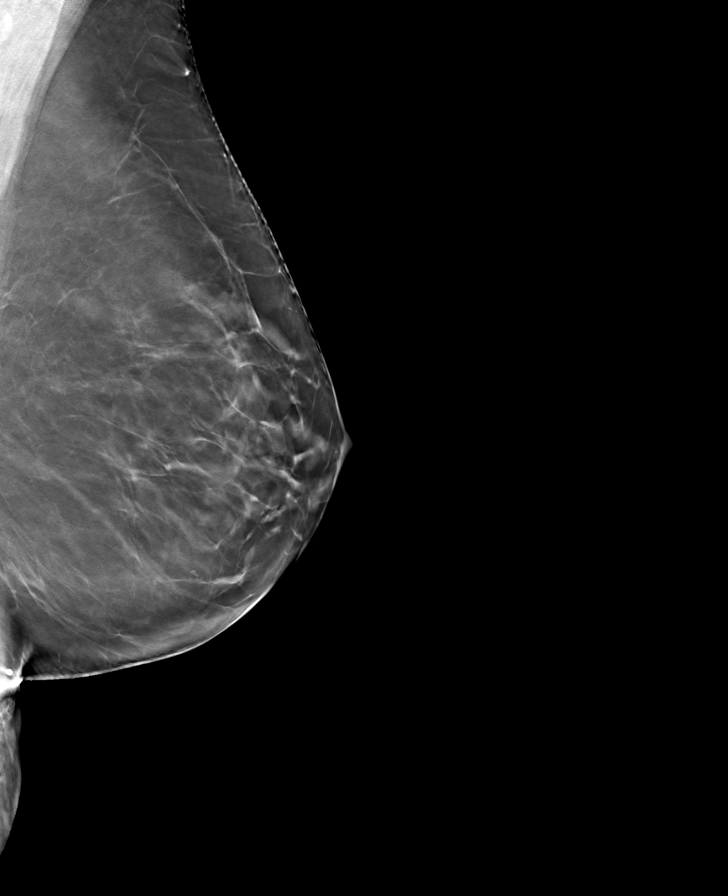

[L CC tomo · tomo slice 37/74.0]
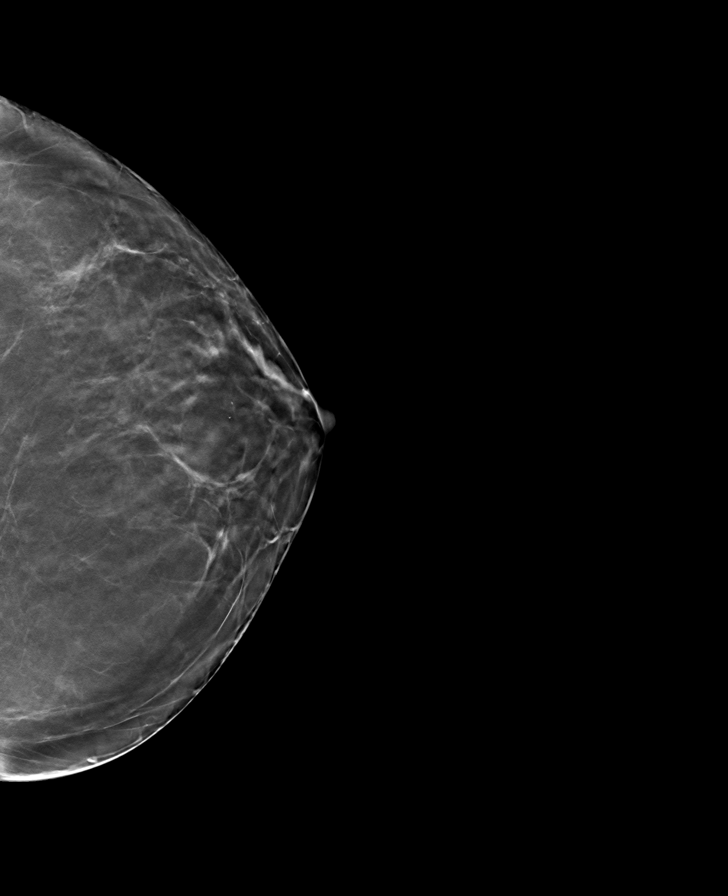

[R MLO tomo · tomo slice 39/76.0]
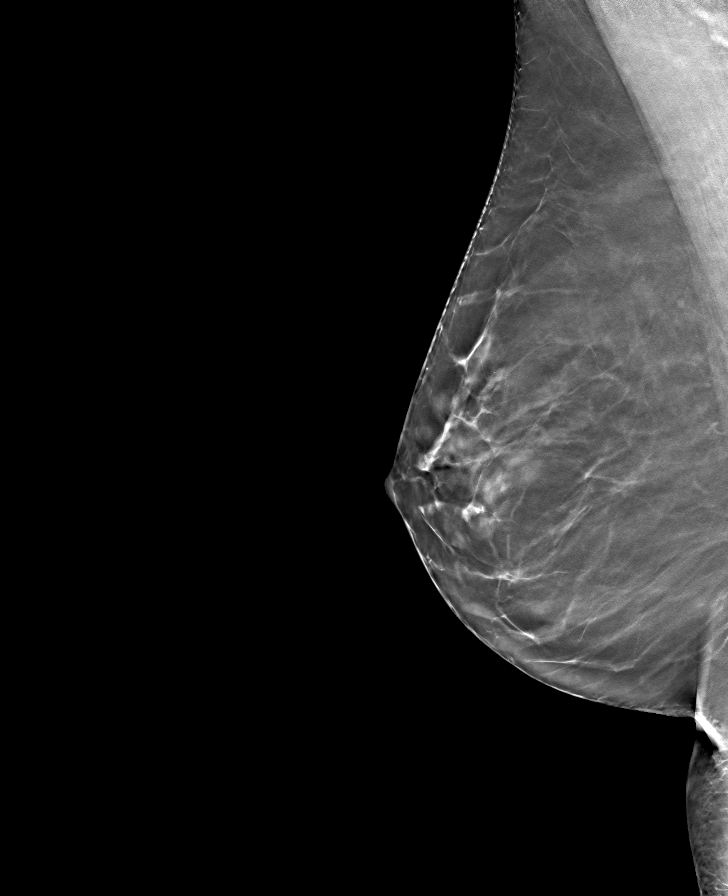

[8 of 24 positions shown; findings below may reference images not displayed]

ACR Breast Density Category b: There are scattered areas of
fibroglandular density.
FINDINGS: There are no findings suspicious for malignancy.
IMPRESSION: No mammographic evidence of malignancy. A result letter of this
screening mammogram will be mailed directly to the patient.

RECOMMENDATION:
Screening mammogram in one year. (Code:51-O-LD2)

BI-RADS CATEGORY  1: Negative.

## 2023-09-06 ENCOUNTER — Other Ambulatory Visit: Payer: Self-pay | Admitting: Internal Medicine

## 2023-09-07 ENCOUNTER — Other Ambulatory Visit: Payer: Self-pay | Admitting: Internal Medicine

## 2023-09-17 DIAGNOSIS — H6063 Unspecified chronic otitis externa, bilateral: Secondary | ICD-10-CM | POA: Diagnosis not present

## 2023-09-17 DIAGNOSIS — H6123 Impacted cerumen, bilateral: Secondary | ICD-10-CM | POA: Diagnosis not present

## 2023-09-19 DIAGNOSIS — N393 Stress incontinence (female) (male): Secondary | ICD-10-CM | POA: Diagnosis not present

## 2023-09-19 DIAGNOSIS — N398 Other specified disorders of urinary system: Secondary | ICD-10-CM | POA: Diagnosis not present

## 2023-10-18 ENCOUNTER — Other Ambulatory Visit: Payer: Self-pay | Admitting: Internal Medicine

## 2023-11-01 DIAGNOSIS — N393 Stress incontinence (female) (male): Secondary | ICD-10-CM | POA: Diagnosis not present

## 2023-11-01 DIAGNOSIS — Z79899 Other long term (current) drug therapy: Secondary | ICD-10-CM | POA: Diagnosis not present

## 2023-11-01 DIAGNOSIS — I2510 Atherosclerotic heart disease of native coronary artery without angina pectoris: Secondary | ICD-10-CM | POA: Diagnosis not present

## 2023-11-01 DIAGNOSIS — E785 Hyperlipidemia, unspecified: Secondary | ICD-10-CM | POA: Diagnosis not present

## 2023-11-01 DIAGNOSIS — Z79890 Hormone replacement therapy (postmenopausal): Secondary | ICD-10-CM | POA: Diagnosis not present

## 2023-11-01 DIAGNOSIS — K219 Gastro-esophageal reflux disease without esophagitis: Secondary | ICD-10-CM | POA: Diagnosis not present

## 2023-11-01 DIAGNOSIS — E890 Postprocedural hypothyroidism: Secondary | ICD-10-CM | POA: Diagnosis not present

## 2023-11-01 DIAGNOSIS — Z7982 Long term (current) use of aspirin: Secondary | ICD-10-CM | POA: Diagnosis not present

## 2023-11-23 ENCOUNTER — Encounter: Payer: Self-pay | Admitting: Internal Medicine

## 2023-11-24 ENCOUNTER — Telehealth: Admitting: Family

## 2023-11-24 ENCOUNTER — Other Ambulatory Visit: Payer: Self-pay | Admitting: Family

## 2023-11-24 DIAGNOSIS — U071 COVID-19: Secondary | ICD-10-CM

## 2023-11-24 MED ORDER — MOLNUPIRAVIR EUA 200MG CAPSULE: 4.0000 | ORAL_CAPSULE | Freq: Two times a day (BID) | ORAL | 0 refills | Status: DC

## 2023-11-24 MED ORDER — NIRMATRELVIR/RITONAVIR (PAXLOVID)TABLET: 3.0000 | ORAL_TABLET | Freq: Two times a day (BID) | ORAL | 0 refills | Status: AC

## 2023-11-24 NOTE — Progress Notes (Signed)
 Virtual Visit Consent   [name], you are scheduled for a virtual visit with a [facility] [place] provider today. Just as with appointments in the office, your consent must be obtained to participate. Your consent will be active for this visit and any virtual visit you may have with one of our providers in the next 365 days. If you have a MyChart account, a copy of this consent can be sent to you electronically.  As this is a virtual visit, video technology does not allow for your provider to perform a traditional examination. This may limit your provider's ability to fully assess your condition. If your provider identifies any concerns that need to be evaluated in person or the need to arrange testing (such as labs, EKG, etc.), we will make arrangements to do so. Although advances in technology are sophisticated, we cannot ensure that it will always work on either your end or our end. If the connection with a video visit is poor, the visit may have to be switched to a telephone visit. With either a video or telephone visit, we are not always able to ensure that we have a secure connection.  By engaging in this virtual visit, you consent to the provision of healthcare and authorize for your insurance to be billed (if applicable) for the services provided during this visit. Depending on your insurance coverage, you may receive a charge related to this service.  I need to obtain your verbal consent now. Are you willing to proceed with your visit today? Kellie Roberson has provided verbal consent on 76/21/2025 for a virtual visit (video or telephone). Bari Learn, FNP  Date: 11/24/2023 9:03 AM   Virtual Visit via Video Note   I, Bari Learn, connected with  Kellie Roberson  ([id number], [birth year]) on 11/24/23 at  9:00 AM EDT by a video-enabled telemedicine application and verified that I am speaking with the correct person using two identifiers.  Location: Patient: Virtual Visit Location Patient:  Home Provider: Virtual Visit Location Provider: Home Office   I discussed the limitations of evaluation and management by telemedicine and the availability of in person appointments. The patient expressed understanding and agreed to proceed.    History of Present Illness: Kellie Roberson is a 78 y.o. who identifies as a female who was assigned female at birth, and is being seen today for COVID. She reports her symptoms started two days ago and tested positive yesterday.   HPI: URI  This is a new problem. The current episode started in the past 7 days. The problem has been unchanged. Maximum temperature: low grade. Associated symptoms include congestion, coughing, headaches, joint pain, nausea, rhinorrhea, sinus pain, sneezing and a sore throat. Pertinent negatives include no ear pain or vomiting. She has tried acetaminophen for the symptoms. The treatment provided mild relief.    Problems:  Patient Active Problem List   Diagnosis Date Noted   Polyarthralgia 04/30/2023   History of uterosacral colpopexy 04/30/2023   Hearing loss secondary to cerumen impaction, bilateral 09/03/2022   Myalgia due to statin 01/01/2022   Thoracic aortic atherosclerosis (HCC) 12/31/2020   Patent foramen ovale 12/31/2020   Pelvic relaxation due to cystocele, midline 01/06/2020   PVC (premature ventricular contraction) 10/28/2019   Coronary atherosclerosis due to calcified coronary lesion (CODE) 10/28/2019   Encounter for preventive health examination 10/28/2019   Hordeolum externum (stye) 11/17/2018   Screening for osteoporosis 07/28/2018   Breast cancer screening 07/28/2018   Colon cancer screening 07/28/2018  White coat syndrome with high blood pressure but without hypertension 04/23/2016   S/P laparoscopic supracervical hysterectomy 10/23/2015   S/P BSO (bilateral salpingo-oophorectomy) 10/23/2015   Post-menopause on HRT (hormone replacement therapy) 10/23/2015   Postoperative hypothyroidism 10/23/2015     Allergies: No Known Allergies Medications:  Current Outpatient Medications:    molnupiravir  EUA (LAGEVRIO ) 200 mg CAPS capsule, Take 4 capsules (800 mg total) by mouth 2 (two) times daily for 5 days., Disp: 40 capsule, Rfl: 0   Ascorbic Acid (VITAMIN C) 1000 MG tablet, Take 1,000 mg by mouth daily., Disp: , Rfl:    aspirin  81 MG EC tablet, Take 1 tablet (81 mg total) by mouth daily. Swallow whole., Disp: 30 tablet, Rfl: 12   estradiol  (CLIMARA  - DOSED IN MG/24 HR) 0.025 mg/24hr patch, USE AS DIRECTED, Disp: 12 patch, Rfl: 1   meloxicam  (MOBIC ) 15 MG tablet, TAKE 1 TABLET (15 MG TOTAL) BY MOUTH DAILY., Disp: 90 tablet, Rfl: 0   metoprolol  succinate (TOPROL -XL) 25 MG 24 hr tablet, TAKE 1 TABLET (25 MG TOTAL) BY MOUTH DAILY., Disp: 90 tablet, Rfl: 1   Multiple Vitamin (MULTIVITAMIN WITH MINERALS) TABS tablet, Take 1 tablet by mouth daily., Disp: , Rfl:    Omega-3 Fatty Acids (FISH OIL) 1000 MG CAPS, Take 1,000 mg by mouth daily., Disp: , Rfl:    Probiotic Product (PROBIOTIC-10 PO), Take by mouth daily., Disp: , Rfl:    SYNTHROID  100 MCG tablet, TAKE 1 TABLET BY MOUTH EVERY DAY, Disp: 90 tablet, Rfl: 1  Observations/Objective: Patient is well-developed, well-nourished in no acute distress.  Resting comfortably  at home.  Head is normocephalic, atraumatic.  No labored breathing.  Speech is clear and coherent with logical content.  Patient is alert and oriented at baseline.  Dry cough  Assessment and Plan: 1. COVID-19 (Primary) - molnupiravir  EUA (LAGEVRIO ) 200 mg CAPS capsule; Take 4 capsules (800 mg total) by mouth 2 (two) times daily for 5 days.  Dispense: 40 capsule; Refill: 0  COVID positive, rest, force fluids, tylenol as needed, report any worsening symptoms such as increased shortness of breath, swelling, or continued high fevers. Possible adverse effects discussed with antivirals.    Follow Up Instructions: I discussed the assessment and treatment plan with the patient. The  patient was provided an opportunity to ask questions and all were answered. The patient agreed with the plan and demonstrated an understanding of the instructions.  A copy of instructions were sent to the patient via MyChart unless otherwise noted below.     The patient was advised to call back or seek an in-person evaluation if the symptoms worsen or if the condition fails to improve as anticipated.    Bari Learn, FNP

## 2023-11-24 NOTE — Progress Notes (Signed)
 Molnupiravir  rx was going to be over $1000.  Will send in Paxlovid  in stead. Will no charge this visit.

## 2023-11-25 NOTE — Telephone Encounter (Signed)
 noted

## 2023-11-29 ENCOUNTER — Other Ambulatory Visit: Payer: Self-pay | Admitting: Internal Medicine

## 2023-11-29 DIAGNOSIS — Z1231 Encounter for screening mammogram for malignant neoplasm of breast: Secondary | ICD-10-CM

## 2023-12-31 ENCOUNTER — Ambulatory Visit
Admission: RE | Admit: 2023-12-31 | Discharge: 2023-12-31 | Disposition: A | Discharge status: Home / Self Care | Source: Ambulatory Visit | Attending: Internal Medicine | Admitting: Internal Medicine

## 2023-12-31 DIAGNOSIS — Z1231 Encounter for screening mammogram for malignant neoplasm of breast: Secondary | ICD-10-CM | POA: Insufficient documentation

## 2024-01-03 ENCOUNTER — Encounter: Payer: Self-pay | Admitting: Internal Medicine

## 2024-01-04 ENCOUNTER — Other Ambulatory Visit: Payer: Self-pay | Admitting: Internal Medicine

## 2024-01-06 ENCOUNTER — Ambulatory Visit: Payer: Self-pay

## 2024-01-06 ENCOUNTER — Encounter: Payer: Self-pay | Admitting: Internal Medicine

## 2024-01-06 MED ORDER — SYNTHROID 100 MCG PO TABS: ORAL_TABLET | ORAL | 0 refills | Status: DC

## 2024-01-09 DIAGNOSIS — L814 Other melanin hyperpigmentation: Secondary | ICD-10-CM | POA: Diagnosis not present

## 2024-01-09 DIAGNOSIS — D2339 Other benign neoplasm of skin of other parts of face: Secondary | ICD-10-CM | POA: Diagnosis not present

## 2024-01-09 DIAGNOSIS — L905 Scar conditions and fibrosis of skin: Secondary | ICD-10-CM | POA: Diagnosis not present

## 2024-01-09 DIAGNOSIS — L821 Other seborrheic keratosis: Secondary | ICD-10-CM | POA: Diagnosis not present

## 2024-01-09 DIAGNOSIS — C44321 Squamous cell carcinoma of skin of nose: Secondary | ICD-10-CM | POA: Diagnosis not present

## 2024-01-09 DIAGNOSIS — D492 Neoplasm of unspecified behavior of bone, soft tissue, and skin: Secondary | ICD-10-CM | POA: Diagnosis not present

## 2024-01-21 NOTE — Telephone Encounter (Signed)
 open in error

## 2024-02-10 DIAGNOSIS — C44321 Squamous cell carcinoma of skin of nose: Secondary | ICD-10-CM | POA: Diagnosis not present

## 2024-02-17 ENCOUNTER — Ambulatory Visit: Admitting: *Deleted

## 2024-02-17 VITALS — Ht 66.0 in | Wt 155.0 lb

## 2024-02-17 DIAGNOSIS — Z0000 Encounter for general adult medical examination without abnormal findings: Secondary | ICD-10-CM | POA: Diagnosis not present

## 2024-02-17 NOTE — Patient Instructions (Signed)
 Kellie Roberson,  Thank you for taking the time for your Medicare Wellness Visit. I appreciate your continued commitment to your health goals. Please review the care plan we discussed, and feel free to reach out if I can assist you further.  Please note that Annual Wellness Visits do not include a physical exam. Some assessments may be limited, especially if the visit was conducted virtually. If needed, we may recommend an in-person follow-up with your provider.  Ongoing Care Seeing your primary care provider every 3 to 6 months helps us  monitor your health and provide consistent, personalized care.  Remember to update your vaccine.  Referrals If a referral was made during today's visit and you haven't received any updates within two weeks, please contact the referred provider directly to check on the status.  Recommended Screenings:  Health Maintenance  Topic Date Due   COVID-19 Vaccine (8 - 2025-26 season) 11/04/2023   Breast Cancer Screening  12/30/2024   Medicare Annual Wellness Visit  02/16/2025   DTaP/Tdap/Td vaccine (3 - Td or Tdap) 03/14/2031   Pneumococcal Vaccine for age over 74  Completed   Flu Shot  Completed   Osteoporosis screening with Bone Density Scan  Completed   Hepatitis C Screening  Completed   Zoster (Shingles) Vaccine  Completed   Meningitis B Vaccine  Aged Out   Colon Cancer Screening  Discontinued   Cologuard (Stool DNA test)  Discontinued       02/13/2024    9:56 AM  Advanced Directives  Does Patient Have a Medical Advance Directive? Yes  Type of Estate Agent of [place];Living will  Does patient want to make changes to medical advance directive? No - Patient declined  Copy of Healthcare Power of Attorney in Chart? Yes - validated most recent copy scanned in chart (See row information)    Vision: Annual vision screenings are recommended for early detection of glaucoma, cataracts, and diabetic retinopathy. These exams can also reveal  signs of chronic conditions such as diabetes and high blood pressure.  Dental: Annual dental screenings help detect early signs of oral cancer, gum disease, and other conditions linked to overall health, including heart disease and diabetes.  Please see the attached documents for additional preventive care recommendations.

## 2024-02-17 NOTE — Progress Notes (Signed)
 Chief Complaint  Patient presents with   Medicare Wellness     Subjective:   Kellie Roberson is a 78 y.o. female who presents for a Medicare Annual Wellness Visit.  Visit info / Clinical Intake: Medicare Wellness Visit Type:: Subsequent Annual Wellness Visit Persons participating in visit and providing information:: patient Medicare Wellness Visit Mode:: Telephone If telephone:: video declined Since this visit was completed virtually, some vitals may be partially provided or unavailable. Missing vitals are due to the limitations of the virtual format.: Unable to obtain vitals - no equipment If Telephone or Video please confirm:: I connected with patient using audio/video enable telemedicine. I verified patient identity with two identifiers, discussed telehealth limitations, and patient agreed to proceed. Patient Location:: Home Provider Location:: Office/Home Interpreter Needed?: No Pre-visit prep was completed: yes AWV questionnaire completed by patient prior to visit?: yes Date:: 02/13/24 Living arrangements:: (Patient-Rptd) lives with spouse/significant other Patient's Overall Health Status Rating: (Patient-Rptd) excellent Typical amount of pain: (Patient-Rptd) some Does pain affect daily life?: (Patient-Rptd) no Are you currently prescribed opioids?: no  Dietary Habits and Nutritional Risks How many meals a day?: (Patient-Rptd) 3 Eats fruit and vegetables daily?: (Patient-Rptd) yes Most meals are obtained by: (Patient-Rptd) preparing own meals In the last 2 weeks, have you had any of the following?: none Diabetic:: no  Functional Status Activities of Daily Living (to include ambulation/medication): (Patient-Rptd) Independent Ambulation: (Patient-Rptd) Independent Medication Administration: (Patient-Rptd) Independent Home Management (perform basic housework or laundry): (Patient-Rptd) Independent Manage your own finances?: (Patient-Rptd) yes Primary transportation is:  (Patient-Rptd) driving Concerns about vision?: no *vision screening is required for WTM* Concerns about hearing?: no  Fall Screening Falls in the past year?: (Patient-Rptd) 0 Number of falls in past year: 0 Was there an injury with Fall?: 0 Fall Risk Category Calculator: 0 Patient Fall Risk Level: Low Fall Risk  Fall Risk Patient at Risk for Falls Due to: No Fall Risks Fall risk Follow up: Falls evaluation completed; Falls prevention discussed  Home and Transportation Safety: All rugs have non-skid backing?: (Patient-Rptd) yes All stairs or steps have railings?: (Patient-Rptd) yes Grab bars in the bathtub or shower?: (Patient-Rptd) yes Have non-skid surface in bathtub or shower?: (Patient-Rptd) yes Good home lighting?: (Patient-Rptd) yes Regular seat belt use?: (Patient-Rptd) yes Hospital stays in the last year:: (Patient-Rptd) no  Cognitive Assessment Difficulty concentrating, remembering, or making decisions? : (Patient-Rptd) no Will 6CIT or Mini Cog be Completed: yes What year is it?: 0 points What month is it?: 0 points Give patient an address phrase to remember (5 components): [address] [place] About what time is it?: 0 points Count backwards from 20 to 1: 0 points Say the months of the year in reverse: 0 points Repeat the address phrase from earlier: 0 points 6 CIT Score: 0 points  Advance Directives (For Healthcare) Does Patient Have a Medical Advance Directive?: Yes Does patient want to make changes to medical advance directive?: No - Patient declined Type of Advance Directive: Healthcare Power of [place]; Living will Copy of Healthcare Power of Attorney in Chart?: Yes - validated most recent copy scanned in chart (See row information) Copy of Living Will in Chart?: Yes - validated most recent copy scanned in chart (See row information)  Reviewed/Updated  Reviewed/Updated: Reviewed All (Medical, Surgical, Family, Medications, Allergies, Care Teams,  Patient Goals)    Allergies (verified) Patient has no known allergies.   Current Medications (verified) Outpatient Encounter Medications as of 02/17/2024  Medication Sig   Ascorbic  Acid (VITAMIN C) 1000 MG tablet Take 1,000 mg by mouth daily.   aspirin  81 MG EC tablet Take 1 tablet (81 mg total) by mouth daily. Swallow whole.   estradiol  (CLIMARA  - DOSED IN MG/24 HR) 0.025 mg/24hr patch USE AS DIRECTED   meloxicam  (MOBIC ) 15 MG tablet TAKE 1 TABLET (15 MG TOTAL) BY MOUTH DAILY. (Patient taking differently: Take 15 mg by mouth daily. Take 1/2 daily)   metoprolol  succinate (TOPROL -XL) 25 MG 24 hr tablet TAKE 1 TABLET (25 MG TOTAL) BY MOUTH DAILY.   Multiple Vitamin (MULTIVITAMIN WITH MINERALS) TABS tablet Take 1 tablet by mouth daily.   Omega-3 Fatty Acids (FISH OIL) 1000 MG CAPS Take 1,000 mg by mouth daily.   Probiotic Product (PROBIOTIC-10 PO) Take by mouth daily.   SYNTHROID  100 MCG tablet TAKE 1 TABLET BY MOUTH EVERY DAY   No facility-administered encounter medications on file as of 02/17/2024.    History: Past Medical History:  Diagnosis Date   Cancer (HCC) 09/22/2018   skin ca-basal cell   Thyroid  disease    Past Surgical History:  Procedure Laterality Date   ABDOMINAL HYSTERECTOMY  2003   total-had some structural damage after 2nd pregnancy   CYSTOCELE REPAIR  04/2023   INCONTINENCE SURGERY  08/2023   INCONTINENCE SURGERY  10/2023   THYROIDECTOMY  1975   was hyperthyroid   Family History  Problem Relation Age of Onset   Alzheimer's disease Mother    Hypertension Father    Glaucoma Father    Thyroid  disease Sister    Mitral valve prolapse Sister    Breast cancer Neg Hx    Social History   Occupational History   Not on file  Tobacco Use   Smoking status: Never   Smokeless tobacco: Never  Vaping Use   Vaping status: Never Used  Substance and Sexual Activity   Alcohol use: Yes    Alcohol/week: 10.0 standard drinks of alcohol    Types: 10 Glasses of wine  per week   Drug use: No   Sexual activity: Not on file   Tobacco Counseling Counseling given: Not Answered  SDOH Screenings   Food Insecurity: No Food Insecurity (02/13/2024)  Housing: Low Risk (02/13/2024)  Transportation Needs: No Transportation Needs (02/13/2024)  Utilities: Not At Risk (02/17/2024)  Alcohol Screen: Low Risk (02/13/2024)  Depression (PHQ2-9): Low Risk (02/17/2024)  Financial Resource Strain: Low Risk (02/13/2024)  Physical Activity: Sufficiently Active (02/13/2024)  Social Connections: Moderately Integrated (02/13/2024)  Stress: No Stress Concern Present (02/13/2024)  Tobacco Use: Low Risk (02/17/2024)  Health Literacy: Adequate Health Literacy (02/17/2024)   See flowsheets for full screening details  Depression Screen PHQ 2 & 9 Depression Scale- Over the past 2 weeks, how often have you been bothered by any of the following problems? Little interest or pleasure in doing things: 0 Feeling down, depressed, or hopeless (PHQ Adolescent also includes...irritable): 0 PHQ-2 Total Score: 0 Trouble falling or staying asleep, or sleeping too much: 0 Feeling tired or having little energy: 0 Poor appetite or overeating (PHQ Adolescent also includes...weight loss): 0 Feeling bad about yourself - or that you are a failure or have let yourself or your family down: 0 Trouble concentrating on things, such as reading the newspaper or watching television (PHQ Adolescent also includes...like school work): 0 Moving or speaking so slowly that other people could have noticed. Or the opposite - being so fidgety or restless that you have been moving around a lot more than usual: 0 Thoughts that  you would be better off dead, or of hurting yourself in some way: 0 PHQ-9 Total Score: 0 If you checked off any problems, how difficult have these problems made it for you to do your work, take care of things at home, or get along with other people?: Not difficult at all     Goals Addressed              This Visit's Progress    Patient Stated       Wants to join a GYM             Objective:    Today's Vitals   02/17/24 1257  Weight: 155 lb (70.3 kg)  Height: 5' 6 (1.676 m)   Body mass index is 25.02 kg/m.  Hearing/Vision screen Hearing Screening - Comments:: No issues Vision Screening - Comments:: Glasses, Mevelyn Slack, up to date Immunizations and Health Maintenance Health Maintenance  Topic Date Due   COVID-19 Vaccine (8 - 2025-26 season) 11/04/2023   Mammogram  12/30/2024   Medicare Annual Wellness (AWV)  02/16/2025   DTaP/Tdap/Td (3 - Td or Tdap) 03/14/2031   Pneumococcal Vaccine: 50+ Years  Completed   Influenza Vaccine  Completed   Bone Density Scan  Completed   Hepatitis C Screening  Completed   Zoster Vaccines- Shingrix  Completed   Meningococcal B Vaccine  Aged Out   Colonoscopy  Discontinued   Fecal DNA (Cologuard)  Discontinued        Assessment/Plan:  This is a routine wellness examination for Ikran.  Patient Care Team: Marylynn Verneita CROME, MD as PCP - General (Internal Medicine) Darliss Rogue, MD as PCP - Cardiology (Cardiology) [place], [facility] [place] Dorice Pizza, MD as Referring Physician (Obstetrics and Gynecology)  I have personally reviewed and noted the following in the patients chart:   Medical and social history Use of alcohol, tobacco or illicit drugs  Current medications and supplements including opioid prescriptions. Functional ability and status Nutritional status Physical activity Advanced directives List of other physicians Hospitalizations, surgeries, and ER visits in previous 12 months Vitals Screenings to include cognitive, depression, and falls Referrals and appointments  No orders of the defined types were placed in this encounter.  In addition, I have reviewed and discussed with patient certain preventive protocols, quality metrics, and best practice recommendations. A written personalized  care plan for preventive services as well as general preventive health recommendations were provided to patient.   Angeline Fredericks, LPN   87/84/7974   Return in 1 year (on 02/16/2025).  After Visit Summary: (MyChart) Due to this being a telephonic visit, the after visit summary with patients personalized plan was offered to patient via MyChart   Nurse Notes: Discussed the need to update covid vaccine which patient stated she recently had covid and is waiting on getting the vaccine.

## 2024-02-18 DIAGNOSIS — H2513 Age-related nuclear cataract, bilateral: Secondary | ICD-10-CM | POA: Diagnosis not present

## 2024-02-18 DIAGNOSIS — H353131 Nonexudative age-related macular degeneration, bilateral, early dry stage: Secondary | ICD-10-CM | POA: Diagnosis not present

## 2024-03-13 ENCOUNTER — Other Ambulatory Visit: Payer: Self-pay | Admitting: Internal Medicine

## 2024-03-26 ENCOUNTER — Encounter: Payer: Self-pay | Admitting: Internal Medicine

## 2024-04-01 ENCOUNTER — Other Ambulatory Visit: Payer: Self-pay

## 2024-04-01 DIAGNOSIS — R5383 Other fatigue: Secondary | ICD-10-CM

## 2024-04-01 DIAGNOSIS — E890 Postprocedural hypothyroidism: Secondary | ICD-10-CM

## 2024-04-01 DIAGNOSIS — E782 Mixed hyperlipidemia: Secondary | ICD-10-CM

## 2024-04-01 DIAGNOSIS — R7301 Impaired fasting glucose: Secondary | ICD-10-CM

## 2024-04-01 DIAGNOSIS — R030 Elevated blood-pressure reading, without diagnosis of hypertension: Secondary | ICD-10-CM

## 2024-04-01 NOTE — Telephone Encounter (Signed)
 Copied from CRM [id number]. Topic: Clinical - Medication Refill >> Apr 01, 2024  8:19 AM Laymon HERO wrote: Medication: SYNTHROID  100 MCG tablet  Has the patient contacted their pharmacy? Yes (Agent: If no, request that the patient contact the pharmacy for the refill. If patient does not wish to contact the pharmacy document the reason why and proceed with request.) (Agent: If yes, when and what did the pharmacy advise?)  This is the patient's preferred pharmacy:  [phone], [place] - [address] [place] [address] Phone: [phone] Fax: [phone]  Is this the correct pharmacy for this prescription? Yes If no, delete pharmacy and type the correct one.   Has the prescription been filled recently? Yes  Is the patient out of the medication? Yes  Has the patient been seen for an appointment in the last year OR does the patient have an upcoming appointment? Yes  Can we respond through MyChart? Yes  Agent: Please be advised that Rx refills may take up to 3 business days. We ask that you follow-up with your pharmacy.

## 2024-04-02 ENCOUNTER — Other Ambulatory Visit (INDEPENDENT_AMBULATORY_CARE_PROVIDER_SITE_OTHER)

## 2024-04-02 ENCOUNTER — Encounter: Payer: Self-pay | Admitting: Cardiology

## 2024-04-02 ENCOUNTER — Ambulatory Visit: Admitting: Cardiology

## 2024-04-02 VITALS — BP 140/78 | HR 65 | Ht 66.0 in | Wt 159.6 lb

## 2024-04-02 DIAGNOSIS — R7301 Impaired fasting glucose: Secondary | ICD-10-CM | POA: Diagnosis not present

## 2024-04-02 DIAGNOSIS — R030 Elevated blood-pressure reading, without diagnosis of hypertension: Secondary | ICD-10-CM

## 2024-04-02 DIAGNOSIS — E890 Postprocedural hypothyroidism: Secondary | ICD-10-CM | POA: Diagnosis not present

## 2024-04-02 DIAGNOSIS — E782 Mixed hyperlipidemia: Secondary | ICD-10-CM

## 2024-04-02 DIAGNOSIS — I471 Supraventricular tachycardia, unspecified: Secondary | ICD-10-CM

## 2024-04-02 DIAGNOSIS — R5383 Other fatigue: Secondary | ICD-10-CM | POA: Diagnosis not present

## 2024-04-02 LAB — LIPID PANEL
Cholesterol: 223 mg/dL — ABNORMAL HIGH (ref 28–200)
HDL: 70.1 mg/dL
LDL Cholesterol: 135 mg/dL — ABNORMAL HIGH (ref 10–99)
NonHDL: 152.92
Total CHOL/HDL Ratio: 3
Triglycerides: 91 mg/dL (ref 10.0–149.0)
VLDL: 18.2 mg/dL (ref 0.0–40.0)

## 2024-04-02 LAB — CBC WITH DIFFERENTIAL/PLATELET
Basophils Absolute: 0 10*3/uL (ref 0.0–0.1)
Basophils Relative: 0.3 % (ref 0.0–3.0)
Eosinophils Absolute: 0.1 10*3/uL (ref 0.0–0.7)
Eosinophils Relative: 2.2 % (ref 0.0–5.0)
HCT: 42.9 % (ref 36.0–46.0)
Hemoglobin: 14.4 g/dL (ref 12.0–15.0)
Lymphocytes Relative: 30.3 % (ref 12.0–46.0)
Lymphs Abs: 1.6 10*3/uL (ref 0.7–4.0)
MCHC: 33.6 g/dL (ref 30.0–36.0)
MCV: 93.5 fl (ref 78.0–100.0)
Monocytes Absolute: 0.6 10*3/uL (ref 0.1–1.0)
Monocytes Relative: 10.4 % (ref 3.0–12.0)
Neutro Abs: 3.1 10*3/uL (ref 1.4–7.7)
Neutrophils Relative %: 56.8 % (ref 43.0–77.0)
Platelets: 192 10*3/uL (ref 150.0–400.0)
RBC: 4.59 Mil/uL (ref 3.87–5.11)
RDW: 13.5 % (ref 11.5–15.5)
WBC: 5.4 10*3/uL (ref 4.0–10.5)

## 2024-04-02 LAB — LDL CHOLESTEROL, DIRECT: Direct LDL: 142 mg/dL

## 2024-04-02 LAB — COMPREHENSIVE METABOLIC PANEL WITH GFR
ALT: 21 U/L (ref 3–35)
AST: 21 U/L (ref 5–37)
Albumin: 4.1 g/dL (ref 3.5–5.2)
Alkaline Phosphatase: 68 U/L (ref 39–117)
BUN: 15 mg/dL (ref 6–23)
CO2: 32 meq/L (ref 19–32)
Calcium: 9.1 mg/dL (ref 8.4–10.5)
Chloride: 103 meq/L (ref 96–112)
Creatinine, Ser: 0.58 mg/dL (ref 0.40–1.20)
GFR: 87.7 mL/min
Glucose, Bld: 83 mg/dL (ref 70–99)
Potassium: 4.3 meq/L (ref 3.5–5.1)
Sodium: 140 meq/L (ref 135–145)
Total Bilirubin: 1.6 mg/dL — ABNORMAL HIGH (ref 0.2–1.2)
Total Protein: 6.8 g/dL (ref 6.0–8.3)

## 2024-04-02 LAB — HEMOGLOBIN A1C: Hgb A1c MFr Bld: 5.6 % (ref 4.6–6.5)

## 2024-04-02 LAB — MICROALBUMIN / CREATININE URINE RATIO
Creatinine,U: 46.7 mg/dL
Microalb Creat Ratio: UNDETERMINED mg/g (ref 0.0–30.0)
Microalb, Ur: <0.7 mg/dL

## 2024-04-02 LAB — TSH: TSH: 0.72 u[IU]/mL (ref 0.35–5.50)

## 2024-04-02 NOTE — Patient Instructions (Signed)

## 2024-04-02 NOTE — Progress Notes (Signed)
 " Cardiology Office Note:    Date:  04/02/2024   ID:  JASMINEMARIE SHERRARD, DOB 03-27-1945, MRN [id number]  PCP:  Marylynn Verneita CROME, MD  Cardiologist:  Redell Cave, MD  Electrophysiologist:  None   Referring MD: Marylynn Verneita CROME, MD   Chief Complaint  Patient presents with   Follow-up    12 month follow up pt has been doing well with no complaints of chest pain, chest pressure or SOB, medication reviewed verbally with patient    History of Present Illness:    Kellie Roberson is a 79 y.o. female with a hx of hypothyroidism, paroxysmal SVT, hyperlipidemia who presents for follow-up.    Has been feeling well with no palpitations, chest pain or shortness of breath.  Compliant with and tolerating Toprol -XL 25 mg daily as prescribed.  No new cardiac concerns.   Prior notes Transthoracic echo 12/2018 showed normal systolic and diastolic function, EF 55 to 60%.   Coronary CTA 01/2019 had a calcium  score of 44, minimal nonobstructive CAD in the distal left main/ostial LAD 0 to 24%.  Cardiac monitor 01/2019 paroxysmal SVT. Statin therapy recommended, patient declined.  Past Medical History:  Diagnosis Date   Cancer (HCC) 09/22/2018   skin ca-basal cell   Thyroid  disease     Past Surgical History:  Procedure Laterality Date   ABDOMINAL HYSTERECTOMY  2003   total-had some structural damage after 2nd pregnancy   CYSTOCELE REPAIR  04/2023   INCONTINENCE SURGERY  08/2023   INCONTINENCE SURGERY  10/2023   THYROIDECTOMY  1975   was hyperthyroid    Current Medications: Current Meds  Medication Sig   Ascorbic Acid (VITAMIN C) 1000 MG tablet Take 1,000 mg by mouth daily.   aspirin  81 MG EC tablet Take 1 tablet (81 mg total) by mouth daily. Swallow whole.   estradiol  (CLIMARA  - DOSED IN MG/24 HR) 0.025 mg/24hr patch USE AS DIRECTED   meloxicam  (MOBIC ) 15 MG tablet TAKE 1 TABLET (15 MG TOTAL) BY MOUTH DAILY. (Patient taking differently: Take 15 mg by mouth daily. Take 1/2 daily)   metoprolol   succinate (TOPROL -XL) 25 MG 24 hr tablet TAKE 1 TABLET (25 MG TOTAL) BY MOUTH DAILY.   Multiple Vitamin (MULTIVITAMIN WITH MINERALS) TABS tablet Take 1 tablet by mouth daily.   Omega-3 Fatty Acids (FISH OIL) 1000 MG CAPS Take 1,000 mg by mouth daily.   Probiotic Product (PROBIOTIC-10 PO) Take by mouth daily.   SYNTHROID  100 MCG tablet TAKE 1 TABLET BY MOUTH EVERY DAY     Allergies:   Patient has no known allergies.   Social History   Socioeconomic History   Marital status: Married    Spouse name: Not on file   Number of children: Not on file   Years of education: Not on file   Highest education level: Master's degree (e.g., MA, MS, MEng, MEd, MSW, MBA)  Occupational History   Not on file  Tobacco Use   Smoking status: Never   Smokeless tobacco: Never  Vaping Use   Vaping status: Never Used  Substance and Sexual Activity   Alcohol use: Yes    Alcohol/week: 10.0 standard drinks of alcohol    Types: 10 Glasses of wine per week   Drug use: No   Sexual activity: Not on file  Other Topics Concern   Not on file  Social History Narrative   Married   Social Drivers of Health   Tobacco Use: Low Risk (04/02/2024)   Patient History  Smoking Tobacco Use: Never    Smokeless Tobacco Use: Never    Passive Exposure: Not on file  Financial Resource Strain: Low Risk (02/13/2024)   Overall Financial Resource Strain (CARDIA)    Difficulty of Paying Living Expenses: Not hard at all  Food Insecurity: No Food Insecurity (02/13/2024)   Epic    Worried About Programme Researcher, Broadcasting/film/video in the Last Year: Never true    Ran Out of Food in the Last Year: Never true  Transportation Needs: No Transportation Needs (02/13/2024)   Epic    Lack of Transportation (Medical): No    Lack of Transportation (Non-Medical): No  Physical Activity: Sufficiently Active (02/13/2024)   Exercise Vital Sign    Days of Exercise per Week: 7 days    Minutes of Exercise per Session: 90 min  Stress: No Stress Concern  Present (02/13/2024)   [employer] of Occupational Health - Occupational Stress Questionnaire    Feeling of Stress: Not at all  Social Connections: Moderately Integrated (02/13/2024)   Social Connection and Isolation Panel    Frequency of Communication with Friends and Family: More than three times a week    Frequency of Social Gatherings with Friends and Family: More than three times a week    Attends Religious Services: Never    Database Administrator or Organizations: Yes    Attends Engineer, Structural: More than 4 times per year    Marital Status: Married  Depression (PHQ2-9): Low Risk (02/17/2024)   Depression (PHQ2-9)    PHQ-2 Score: 0  Alcohol Screen: Low Risk (02/13/2024)   Alcohol Screen    Last Alcohol Screening Score (AUDIT): 4  Housing: Low Risk (02/13/2024)   Epic    Unable to Pay for Housing in the Last Year: No    Number of Times Moved in the Last Year: 0    Homeless in the Last Year: No  Utilities: Not At Risk (02/17/2024)   Epic    Threatened with loss of utilities: No  Health Literacy: Adequate Health Literacy (02/17/2024)   B1300 Health Literacy    Frequency of need for help with medical instructions: Never     Family History: The patient's family history includes Alzheimer's disease in her mother; Glaucoma in her father; Hypertension in her father; Mitral valve prolapse in her sister; Thyroid  disease in her sister. There is no history of Breast cancer.  ROS:   Please see the history of present illness.     All other systems reviewed and are negative.  EKGs/Labs/Other Studies Reviewed:    The following studies were reviewed today:  EKG Interpretation Date/Time:  Thursday April 02 2024 08:51:23 EST Ventricular Rate:  65 PR Interval:  134 QRS Duration:  82 QT Interval:  396 QTC Calculation: 411 R Axis:   -37  Text Interpretation: Normal sinus rhythm Left axis deviation Septal infarct , age undetermined Confirmed by Darliss Rogue ([id number]) on 04/02/2024 8:57:05 AM    Recent Labs: No results found for requested labs within last 365 days.  Recent Lipid Panel    Component Value Date/Time   CHOL 191 09/03/2022 1017   CHOL 229 (H) 07/09/2017 0732   TRIG 151.0 (H) 09/03/2022 1017   HDL 68.40 09/03/2022 1017   HDL 86 07/09/2017 0732   CHOLHDL 3 09/03/2022 1017   VLDL 30.2 09/03/2022 1017   LDLCALC 93 09/03/2022 1017   LDLCALC 128 (H) 07/09/2017 0732   LDLDIRECT 107.0 09/03/2022 1017    Physical Exam:  VS:  BP (!) 140/78 (BP Location: Left Arm, Patient Position: Sitting, Cuff Size: Large)   Pulse 65   Ht 5' 6 (1.676 m)   Wt 159 lb 9.6 oz (72.4 kg)   SpO2 98%   BMI 25.76 kg/m     Wt Readings from Last 3 Encounters:  04/02/24 159 lb 9.6 oz (72.4 kg)  02/17/24 155 lb (70.3 kg)  04/29/23 157 lb 6.4 oz (71.4 kg)     GEN:  Well nourished, well developed in no acute distress HEENT: Normal NECK: No JVD; No carotid bruits CARDIAC: RRR, no murmurs, rubs, gallops RESPIRATORY:  Clear to auscultation without rales, wheezing or rhonchi  ABDOMEN: Soft, non-tender, non-distended MUSCULOSKELETAL:  No edema; No deformity  SKIN: Warm and dry NEUROLOGIC:  Alert and oriented x 3 PSYCHIATRIC:  Normal affect   ASSESSMENT:    1. Paroxysmal SVT (supraventricular tachycardia)   2. Mixed hyperlipidemia   3. Elevated BP reading w/ no diagnosis of HTN    PLAN:     Palpitations, paroxysmal SVT, PVCs, symptoms adequately managed with Toprol -XL.  Continue Toprol -XL 25 mg daily. Hyperlipidemia, continue low-cholesterol diet.  declined statin therapy. BP elevated today, usually controlled.  Continue to monitor.  Follow-up in 1 year  Medication Adjustments/Labs and Tests Ordered: Current medicines are reviewed at length with the patient today.  Concerns regarding medicines are outlined above.  Orders Placed This Encounter  Procedures   EKG 12-Lead   No orders of the defined types were placed in this  encounter.   Patient Instructions  Medication Instructions:  Your physician recommends that you continue on your current medications as directed. Please refer to the Current Medication list given to you today.   *If you need a refill on your cardiac medications before your next appointment, please call your pharmacy*  Lab Work: No labs ordered today  If you have labs (blood work) drawn today and your tests are completely normal, you will receive your results only by: MyChart Message (if you have MyChart) OR A paper copy in the mail If you have any lab test that is abnormal or we need to change your treatment, we will call you to review the results.  Testing/Procedures: No test ordered today   Follow-Up: At [facility], you and your health needs are our priority.  As part of our continuing mission to provide you with exceptional heart care, our providers are all part of one team.  This team includes your primary Cardiologist (physician) and Advanced Practice Providers or APPs (Physician Assistants and Nurse Practitioners) who all work together to provide you with the care you need, when you need it.  Your next appointment:   1 year(s)  Provider:   You may see Redell Cave, MD or one of the following Advanced Practice Providers on your designated Care Team:   Lonni Meager, NP Lesley Maffucci, PA-C Bernardino Bring, PA-C Cadence Washita, PA-C Tylene Lunch, NP Barnie Hila, NP    We recommend signing up for the patient portal called MyChart.  Sign up information is provided on this After Visit Summary.  MyChart is used to connect with patients for Virtual Visits (Telemedicine).  Patients are able to view lab/test results, encounter notes, upcoming appointments, etc.  Non-urgent messages can be sent to your provider as well.   To learn more about what you can do with MyChart, go to [email or web address].              Signed, Redell Cave, MD  04/02/2024  10:25 AM    [facility] "

## 2024-04-04 ENCOUNTER — Other Ambulatory Visit: Payer: Self-pay | Admitting: Internal Medicine

## 2024-04-04 ENCOUNTER — Ambulatory Visit: Payer: Self-pay | Admitting: Internal Medicine

## 2024-04-05 ENCOUNTER — Encounter: Payer: Self-pay | Admitting: Cardiology

## 2024-04-06 ENCOUNTER — Other Ambulatory Visit: Payer: Self-pay | Admitting: Medical Genetics

## 2024-04-06 MED ORDER — SYNTHROID 100 MCG PO TABS: ORAL_TABLET | ORAL | 3 refills | Status: AC

## 2024-04-13 ENCOUNTER — Encounter: Admitting: Internal Medicine

## 2025-02-22 ENCOUNTER — Ambulatory Visit
